# Patient Record
Sex: Male | Born: 1955 | Race: White | Hispanic: No | Marital: Married | Smoking: Former smoker
Health system: Southern US, Community
[De-identification: ages and names within clinical notes are randomized; demographics above are authoritative.]

## PROBLEM LIST (undated history)

## (undated) DIAGNOSIS — M1990 Unspecified osteoarthritis, unspecified site: Secondary | ICD-10-CM

## (undated) DIAGNOSIS — I499 Cardiac arrhythmia, unspecified: Secondary | ICD-10-CM

## (undated) DIAGNOSIS — I10 Essential (primary) hypertension: Secondary | ICD-10-CM

## (undated) DIAGNOSIS — Z889 Allergy status to unspecified drugs, medicaments and biological substances status: Secondary | ICD-10-CM

## (undated) DIAGNOSIS — G709 Myoneural disorder, unspecified: Secondary | ICD-10-CM

## (undated) DIAGNOSIS — M549 Dorsalgia, unspecified: Secondary | ICD-10-CM

## (undated) HISTORY — PX: COLONOSCOPY: SHX174

## (undated) HISTORY — PX: OTHER SURGICAL HISTORY: SHX169

## (undated) HISTORY — PX: ATRIAL FIBRILLATION ABLATION: SHX5732

## (undated) HISTORY — PX: TONSILLECTOMY: SUR1361

## (undated) HISTORY — PX: EYE SURGERY: SHX253

## (undated) HISTORY — PX: HERNIA REPAIR: SHX51

## (undated) HISTORY — PX: HEMORRHOID SURGERY: SHX153

## (undated) HISTORY — PX: WISDOM TOOTH EXTRACTION: SHX21

---

## 2016-11-21 HISTORY — PX: LUMBAR FUSION: SHX111

## 2016-12-31 HISTORY — PX: BACK SURGERY: SHX140

## 2019-07-01 HISTORY — PX: SPINAL CORD STIMULATOR TRIAL: SHX5380

## 2019-10-14 HISTORY — PX: SPINAL CORD STIMULATOR IMPLANT: SHX2422

## 2020-05-23 ENCOUNTER — Encounter (HOSPITAL_COMMUNITY): Payer: Self-pay | Admitting: Emergency Medicine

## 2020-05-23 ENCOUNTER — Inpatient Hospital Stay (HOSPITAL_COMMUNITY)
Admission: EM | Admit: 2020-05-23 | Discharge: 2020-05-30 | DRG: 454 | Disposition: A | Discharge status: Home / Self Care | Payer: Commercial Managed Care - PPO | Attending: Internal Medicine | Admitting: Internal Medicine

## 2020-05-23 ENCOUNTER — Other Ambulatory Visit: Payer: Self-pay

## 2020-05-23 ENCOUNTER — Emergency Department (HOSPITAL_COMMUNITY): Payer: Commercial Managed Care - PPO

## 2020-05-23 DIAGNOSIS — H1132 Conjunctival hemorrhage, left eye: Secondary | ICD-10-CM

## 2020-05-23 DIAGNOSIS — M5416 Radiculopathy, lumbar region: Secondary | ICD-10-CM

## 2020-05-23 DIAGNOSIS — G720 Drug-induced myopathy: Secondary | ICD-10-CM | POA: Diagnosis present

## 2020-05-23 DIAGNOSIS — T368X5S Adverse effect of other systemic antibiotics, sequela: Secondary | ICD-10-CM

## 2020-05-23 DIAGNOSIS — Z981 Arthrodesis status: Secondary | ICD-10-CM

## 2020-05-23 DIAGNOSIS — I4891 Unspecified atrial fibrillation: Secondary | ICD-10-CM | POA: Diagnosis present

## 2020-05-23 DIAGNOSIS — I10 Essential (primary) hypertension: Secondary | ICD-10-CM

## 2020-05-23 DIAGNOSIS — Z791 Long term (current) use of non-steroidal anti-inflammatories (NSAID): Secondary | ICD-10-CM

## 2020-05-23 DIAGNOSIS — H1131 Conjunctival hemorrhage, right eye: Secondary | ICD-10-CM | POA: Diagnosis present

## 2020-05-23 DIAGNOSIS — G894 Chronic pain syndrome: Secondary | ICD-10-CM | POA: Diagnosis present

## 2020-05-23 DIAGNOSIS — Z20822 Contact with and (suspected) exposure to covid-19: Secondary | ICD-10-CM | POA: Diagnosis present

## 2020-05-23 DIAGNOSIS — M5441 Lumbago with sciatica, right side: Secondary | ICD-10-CM | POA: Diagnosis not present

## 2020-05-23 DIAGNOSIS — Z419 Encounter for procedure for purposes other than remedying health state, unspecified: Secondary | ICD-10-CM

## 2020-05-23 DIAGNOSIS — K5900 Constipation, unspecified: Secondary | ICD-10-CM

## 2020-05-23 DIAGNOSIS — Z79899 Other long term (current) drug therapy: Secondary | ICD-10-CM

## 2020-05-23 DIAGNOSIS — M48061 Spinal stenosis, lumbar region without neurogenic claudication: Secondary | ICD-10-CM | POA: Diagnosis not present

## 2020-05-23 DIAGNOSIS — K5903 Drug induced constipation: Secondary | ICD-10-CM | POA: Diagnosis not present

## 2020-05-23 DIAGNOSIS — Z885 Allergy status to narcotic agent status: Secondary | ICD-10-CM

## 2020-05-23 DIAGNOSIS — Z6837 Body mass index (BMI) 37.0-37.9, adult: Secondary | ICD-10-CM

## 2020-05-23 DIAGNOSIS — T84226A Displacement of internal fixation device of vertebrae, initial encounter: Secondary | ICD-10-CM | POA: Diagnosis present

## 2020-05-23 DIAGNOSIS — M960 Pseudarthrosis after fusion or arthrodesis: Secondary | ICD-10-CM | POA: Diagnosis present

## 2020-05-23 DIAGNOSIS — Y92230 Patient room in hospital as the place of occurrence of the external cause: Secondary | ICD-10-CM | POA: Diagnosis not present

## 2020-05-23 DIAGNOSIS — M5116 Intervertebral disc disorders with radiculopathy, lumbar region: Secondary | ICD-10-CM | POA: Diagnosis present

## 2020-05-23 DIAGNOSIS — Z9682 Presence of neurostimulator: Secondary | ICD-10-CM

## 2020-05-23 DIAGNOSIS — R296 Repeated falls: Secondary | ICD-10-CM | POA: Diagnosis present

## 2020-05-23 DIAGNOSIS — R262 Difficulty in walking, not elsewhere classified: Secondary | ICD-10-CM

## 2020-05-23 DIAGNOSIS — T402X5A Adverse effect of other opioids, initial encounter: Secondary | ICD-10-CM | POA: Diagnosis not present

## 2020-05-23 DIAGNOSIS — G609 Hereditary and idiopathic neuropathy, unspecified: Secondary | ICD-10-CM | POA: Diagnosis present

## 2020-05-23 HISTORY — DX: Dorsalgia, unspecified: M54.9

## 2020-05-23 NOTE — ED Triage Notes (Signed)
Pt c/o lower back pain for the past few days getting worse today with numbness on his right leg and unable to walk. Pt states he had a hx of back surgery.

## 2020-05-24 ENCOUNTER — Emergency Department (HOSPITAL_COMMUNITY): Payer: Commercial Managed Care - PPO

## 2020-05-24 ENCOUNTER — Inpatient Hospital Stay (HOSPITAL_COMMUNITY): Payer: Commercial Managed Care - PPO

## 2020-05-24 ENCOUNTER — Encounter (HOSPITAL_COMMUNITY): Payer: Self-pay | Admitting: Emergency Medicine

## 2020-05-24 DIAGNOSIS — Z885 Allergy status to narcotic agent status: Secondary | ICD-10-CM | POA: Diagnosis not present

## 2020-05-24 DIAGNOSIS — H1131 Conjunctival hemorrhage, right eye: Secondary | ICD-10-CM | POA: Diagnosis present

## 2020-05-24 DIAGNOSIS — G720 Drug-induced myopathy: Secondary | ICD-10-CM | POA: Diagnosis present

## 2020-05-24 DIAGNOSIS — R262 Difficulty in walking, not elsewhere classified: Secondary | ICD-10-CM | POA: Insufficient documentation

## 2020-05-24 DIAGNOSIS — G609 Hereditary and idiopathic neuropathy, unspecified: Secondary | ICD-10-CM | POA: Diagnosis present

## 2020-05-24 DIAGNOSIS — R296 Repeated falls: Secondary | ICD-10-CM | POA: Diagnosis present

## 2020-05-24 DIAGNOSIS — T368X5S Adverse effect of other systemic antibiotics, sequela: Secondary | ICD-10-CM | POA: Diagnosis not present

## 2020-05-24 DIAGNOSIS — Z20822 Contact with and (suspected) exposure to covid-19: Secondary | ICD-10-CM | POA: Diagnosis present

## 2020-05-24 DIAGNOSIS — K5900 Constipation, unspecified: Secondary | ICD-10-CM | POA: Diagnosis not present

## 2020-05-24 DIAGNOSIS — H1132 Conjunctival hemorrhage, left eye: Secondary | ICD-10-CM | POA: Diagnosis not present

## 2020-05-24 DIAGNOSIS — M48061 Spinal stenosis, lumbar region without neurogenic claudication: Secondary | ICD-10-CM | POA: Diagnosis present

## 2020-05-24 DIAGNOSIS — M5416 Radiculopathy, lumbar region: Secondary | ICD-10-CM | POA: Diagnosis not present

## 2020-05-24 DIAGNOSIS — M5441 Lumbago with sciatica, right side: Secondary | ICD-10-CM | POA: Diagnosis present

## 2020-05-24 DIAGNOSIS — Y92230 Patient room in hospital as the place of occurrence of the external cause: Secondary | ICD-10-CM | POA: Diagnosis not present

## 2020-05-24 DIAGNOSIS — T84226A Displacement of internal fixation device of vertebrae, initial encounter: Secondary | ICD-10-CM | POA: Diagnosis present

## 2020-05-24 DIAGNOSIS — G894 Chronic pain syndrome: Secondary | ICD-10-CM | POA: Diagnosis present

## 2020-05-24 DIAGNOSIS — Z9682 Presence of neurostimulator: Secondary | ICD-10-CM | POA: Diagnosis not present

## 2020-05-24 DIAGNOSIS — I4891 Unspecified atrial fibrillation: Secondary | ICD-10-CM | POA: Diagnosis present

## 2020-05-24 DIAGNOSIS — T402X5A Adverse effect of other opioids, initial encounter: Secondary | ICD-10-CM | POA: Diagnosis not present

## 2020-05-24 DIAGNOSIS — Z791 Long term (current) use of non-steroidal anti-inflammatories (NSAID): Secondary | ICD-10-CM | POA: Diagnosis not present

## 2020-05-24 DIAGNOSIS — M5116 Intervertebral disc disorders with radiculopathy, lumbar region: Secondary | ICD-10-CM | POA: Diagnosis present

## 2020-05-24 DIAGNOSIS — Z981 Arthrodesis status: Secondary | ICD-10-CM | POA: Diagnosis not present

## 2020-05-24 DIAGNOSIS — I10 Essential (primary) hypertension: Secondary | ICD-10-CM | POA: Diagnosis present

## 2020-05-24 DIAGNOSIS — Z6837 Body mass index (BMI) 37.0-37.9, adult: Secondary | ICD-10-CM | POA: Diagnosis not present

## 2020-05-24 DIAGNOSIS — Z79899 Other long term (current) drug therapy: Secondary | ICD-10-CM | POA: Diagnosis not present

## 2020-05-24 DIAGNOSIS — M960 Pseudarthrosis after fusion or arthrodesis: Secondary | ICD-10-CM | POA: Diagnosis present

## 2020-05-24 DIAGNOSIS — K5903 Drug induced constipation: Secondary | ICD-10-CM | POA: Diagnosis not present

## 2020-05-24 LAB — URINALYSIS, ROUTINE W REFLEX MICROSCOPIC
Bilirubin Urine: NEGATIVE
Glucose, UA: NEGATIVE mg/dL
Hgb urine dipstick: NEGATIVE
Ketones, ur: NEGATIVE mg/dL
Leukocytes,Ua: NEGATIVE
Nitrite: NEGATIVE
Protein, ur: NEGATIVE mg/dL
Specific Gravity, Urine: >1.046 — ABNORMAL HIGH (ref 1.005–1.030)
pH: 5 (ref 5.0–8.0)

## 2020-05-24 LAB — BASIC METABOLIC PANEL
Anion gap: 9 (ref 5–15)
BUN: 16 mg/dL (ref 8–23)
CO2: 30 mmol/L (ref 22–32)
Calcium: 9.1 mg/dL (ref 8.9–10.3)
Chloride: 100 mmol/L (ref 98–111)
Creatinine, Ser: 1.05 mg/dL (ref 0.61–1.24)
GFR, Estimated: >60 mL/min (ref >60)
Glucose, Bld: 106 mg/dL — ABNORMAL HIGH (ref 70–99)
Potassium: 3.6 mmol/L (ref 3.5–5.1)
Sodium: 139 mmol/L (ref 135–145)

## 2020-05-24 LAB — CBC
HCT: 45.5 % (ref 39.0–52.0)
Hemoglobin: 14.8 g/dL (ref 13.0–17.0)
MCH: 30.3 pg (ref 26.0–34.0)
MCHC: 32.5 g/dL (ref 30.0–36.0)
MCV: 93.2 fL (ref 80.0–100.0)
Platelets: 195 10*3/uL (ref 150–400)
RBC: 4.88 MIL/uL (ref 4.22–5.81)
RDW: 13.4 % (ref 11.5–15.5)
WBC: 9.8 10*3/uL (ref 4.0–10.5)
nRBC: 0 % (ref 0.0–0.2)

## 2020-05-24 LAB — RESPIRATORY PANEL BY RT PCR (FLU A&B, COVID)
Influenza A by PCR: NEGATIVE
Influenza B by PCR: NEGATIVE
SARS Coronavirus 2 by RT PCR: NEGATIVE

## 2020-05-24 MED ORDER — KETOROLAC TROMETHAMINE 15 MG/ML IJ SOLN
15.0000 mg | Freq: Four times a day (QID) | INTRAMUSCULAR | Status: DC | PRN
  Administered 2020-05-24: 15 mg via INTRAVENOUS
  Filled 2020-05-24: qty 1

## 2020-05-24 MED ORDER — KETOROLAC TROMETHAMINE 15 MG/ML IJ SOLN: 15.0000 mg | Freq: Four times a day (QID) | INTRAMUSCULAR | Status: DC | PRN

## 2020-05-24 MED ORDER — METHOCARBAMOL 750 MG PO TABS: 750.0000 mg | ORAL_TABLET | Freq: Three times a day (TID) | ORAL | Status: DC | PRN

## 2020-05-24 MED ORDER — CHLORHEXIDINE GLUCONATE CLOTH 2 % EX PADS
6.0000 | MEDICATED_PAD | Freq: Once | CUTANEOUS | Status: AC
  Administered 2020-05-24: 6 via TOPICAL

## 2020-05-24 MED ORDER — MELOXICAM 7.5 MG PO TABS
15.0000 mg | ORAL_TABLET | Freq: Every day | ORAL | Status: DC
  Administered 2020-05-24 – 2020-05-26 (×2): 15 mg via ORAL
  Filled 2020-05-24 (×3): qty 2

## 2020-05-24 MED ORDER — FLUTICASONE PROPIONATE 50 MCG/ACT NA SUSP: 1.0000 | Freq: Every day | NASAL | Status: DC | PRN

## 2020-05-24 MED ORDER — SCOPOLAMINE 1 MG/3DAYS TD PT72
1.0000 | MEDICATED_PATCH | TRANSDERMAL | Status: DC
  Administered 2020-05-25: 1 via TRANSDERMAL
  Filled 2020-05-24: qty 1

## 2020-05-24 MED ORDER — LOSARTAN POTASSIUM 50 MG PO TABS
50.0000 mg | ORAL_TABLET | Freq: Every day | ORAL | Status: DC
  Administered 2020-05-26 – 2020-05-30 (×5): 50 mg via ORAL
  Filled 2020-05-24 (×5): qty 1

## 2020-05-24 MED ORDER — CHLORHEXIDINE GLUCONATE CLOTH 2 % EX PADS: 6.0000 | MEDICATED_PAD | Freq: Once | CUTANEOUS | Status: DC

## 2020-05-24 MED ORDER — ACETAMINOPHEN 500 MG PO TABS: 1000.0000 mg | ORAL_TABLET | Freq: Three times a day (TID) | ORAL | Status: DC | PRN

## 2020-05-24 MED ORDER — HYDROMORPHONE HCL 1 MG/ML IJ SOLN
0.5000 mg | Freq: Once | INTRAMUSCULAR | Status: AC
  Administered 2020-05-24: 0.5 mg via INTRAVENOUS
  Filled 2020-05-24: qty 1

## 2020-05-24 MED ORDER — FENTANYL CITRATE (PF) 100 MCG/2ML IJ SOLN
50.0000 ug | Freq: Once | INTRAMUSCULAR | Status: AC
  Administered 2020-05-24: 50 ug via INTRAVENOUS
  Filled 2020-05-24: qty 2

## 2020-05-24 MED ORDER — MIDAZOLAM HCL 2 MG/2ML IJ SOLN: 1.0000 mg | Freq: Once | INTRAMUSCULAR | Status: DC

## 2020-05-24 MED ORDER — HYDROMORPHONE HCL 1 MG/ML IJ SOLN
1.0000 mg | Freq: Once | INTRAMUSCULAR | Status: AC
  Administered 2020-05-24: 1 mg via INTRAVENOUS
  Filled 2020-05-24: qty 1

## 2020-05-24 MED ORDER — ENOXAPARIN SODIUM 40 MG/0.4ML ~~LOC~~ SOLN
40.0000 mg | SUBCUTANEOUS | Status: DC
  Administered 2020-05-24 – 2020-05-30 (×5): 40 mg via SUBCUTANEOUS
  Filled 2020-05-24 (×6): qty 0.4

## 2020-05-24 MED ORDER — HYDROMORPHONE HCL 1 MG/ML IJ SOLN
0.5000 mg | INTRAMUSCULAR | Status: DC | PRN
  Administered 2020-05-24 – 2020-05-26 (×7): 0.5 mg via INTRAVENOUS
  Filled 2020-05-24 (×2): qty 0.5
  Filled 2020-05-24: qty 1
  Filled 2020-05-24 (×2): qty 0.5

## 2020-05-24 MED ORDER — VITAMIN B-12 1000 MCG PO TABS
1000.0000 ug | ORAL_TABLET | Freq: Every day | ORAL | Status: DC
  Administered 2020-05-26 – 2020-05-30 (×5): 1000 ug via ORAL
  Filled 2020-05-24 (×5): qty 1

## 2020-05-24 MED ORDER — HYDROCHLOROTHIAZIDE 12.5 MG PO CAPS
12.5000 mg | ORAL_CAPSULE | Freq: Every day | ORAL | Status: DC
  Administered 2020-05-24 – 2020-05-30 (×6): 12.5 mg via ORAL
  Filled 2020-05-24 (×6): qty 1

## 2020-05-24 MED ORDER — TRAMADOL HCL 50 MG PO TABS: 50.0000 mg | ORAL_TABLET | Freq: Four times a day (QID) | ORAL | Status: DC | PRN

## 2020-05-24 MED ORDER — ACETAMINOPHEN 500 MG PO TABS
1000.0000 mg | ORAL_TABLET | Freq: Once | ORAL | Status: AC
  Administered 2020-05-25: 1000 mg via ORAL
  Filled 2020-05-24: qty 2

## 2020-05-24 MED ORDER — LOSARTAN POTASSIUM-HCTZ 50-12.5 MG PO TABS: 1.0000 | ORAL_TABLET | Freq: Every day | ORAL | Status: DC

## 2020-05-24 NOTE — ED Notes (Signed)
Followed up with MRI, MRI still waiting on mechanical device representative. Have no timeline will they will arrive. Pt updated.

## 2020-05-24 NOTE — H&P (Signed)
History and Physical    [name] DGL:[id number] DOB: [birth year] DOA: 05/23/2020  PCP: Patient, No Pcp Per (Confirm with patient/family/NH records and if not entered, this has to be entered at [facility] point of entry) Patient coming from: Home  I have personally briefly reviewed patient's old medical records in [facility]  Chief Complaint: Falls  HPI: Thomas Savage is a 64 y.o. male with medical history significant of severe sciatica with right leg pain status post multiple back surgery, and status post spinal stimulator, HTN, idiopathic neuropathy, presented with worsening of right leg pain and weakness.  Baseline ambulation status was able to ambulate despite her chronic back pain and shooting pain to right leg, but last Friday, patient started to experience breakthrough pains, shooting leg from back to side of right thigh and right calf.  He fell several times because of the right leg pain and weakness.  Denies any trouble urinate or bowel movement no fever chills.  No loss of consciousness.  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  Failed trial of Lyrica and gabapentin for worsening of balance problems. ED Course: CT lumbar spine showed extensive postsurgical changes, normal white count, neurosurgery was consulted, who want to obtain MRI and plan for lumbar spine surgery tomorrow to relieve symptoms.  Review of Systems: As per HPI otherwise 14 point review of systems negative.   Past Medical History:  Diagnosis Date  . Back pain     Past Surgical History:  Procedure Laterality Date  . BACK SURGERY  12/31/2016   Oblique Lateral Interbody Fusion with Allograft Dr. Alferd Patee  . LUMBAR FUSION  11/21/2016   L4-S1 Posterior Spinal Fusion   . SPINAL CORD STIMULATOR  IMPLANT Left 10/14/2019   Dr. Petra Kuba  . SPINAL CORD STIMULATOR TRIAL  07/01/2019   Dr. Petra Kuba     has no history on file for tobacco use, alcohol use, and drug use.  Allergies  Allergen Reactions  . Morphine And Related     No family history on file.   Prior to Admission medications   Medication Sig Start Date End Date Taking? Authorizing Provider  acetaminophen (TYLENOL) 500 MG tablet Take 1,000 mg by mouth every 8 (eight) hours as needed for moderate pain.   Yes [provider]  fluticasone (FLONASE) 50 MCG/ACT nasal spray Place 1 spray into both nostrils daily as needed for allergies or rhinitis.   Yes [provider]  losartan-hydrochlorothiazide (HYZAAR) 50-12.5 MG tablet Take 1 tablet by mouth daily. 04/18/20  Yes [provider]  meloxicam (MOBIC) 15 MG tablet Take 15 mg by mouth daily.   Yes [provider]  methocarbamol (ROBAXIN) 750 MG tablet Take 750 mg by mouth every 8 (eight) hours as needed for muscle spasms.   Yes [provider]  traMADol (ULTRAM) 50 MG tablet Take 50 mg by mouth every 6 (six) hours as needed for moderate pain.   Yes [provider]  vitamin B-12 (CYANOCOBALAMIN) 1000 MCG tablet Take 1,000 mcg by mouth daily.   Yes [provider]    Physical Exam: Vitals:   05/24/20 1100 05/24/20 1118 05/24/20 1130 05/24/20 1215  BP:  (!) 140/92 134/86 (!) 145/83  Pulse: 65 67 68 74  Resp: 19 14 14 16   Temp:  TempSrc:      SpO2: 91% 97% 100% 99%  Weight:      Height:        Constitutional: NAD, calm, comfortable Vitals:   05/24/20 1100 05/24/20 1118 05/24/20 1130 05/24/20 1215  BP:  (!) 140/92 134/86 (!) 145/83  Pulse: 65 67 68 74  Resp: 19 14 14 16   Temp:      TempSrc:      SpO2: 91% 97% 100% 99%  Weight:      Height:       Eyes: PERRL, lids and conjunctivae normal ENMT: Mucous membranes are moist. Posterior pharynx clear of any exudate or lesions.Normal dentition.    Neck: normal, supple, no masses, no thyromegaly Respiratory: clear to auscultation bilaterally, no wheezing, no crackles. Normal respiratory effort. No accessory muscle use.  Cardiovascular: Regular rate and rhythm, no murmurs / rubs / gallops. No extremity edema. 2+ pedal pulses. No carotid bruits.  Abdomen: no tenderness, no masses palpated. No hepatosplenomegaly. Bowel sounds positive.  Musculoskeletal: no clubbing / cyanosis. No joint deformity upper and lower extremities. Good ROM, no contractures. Normal muscle tone.  Skin: no rashes, lesions, ulcers. No induration Neurologic: CN 2-12 grossly intact. Sensation intact, DTR normal.  Decreased muscle strength of right leg, positive straight leg elevation test to about 15 to 20 degrees with excruciating pain shooting down the back to right calf.  No trouble moving right foot.  Decreased light touch sensation of the right lateral foot compared to the left side. Psychiatric: Normal judgment and insight. Alert and oriented x 3. Normal mood.     Labs on Admission: I have personally reviewed following labs and imaging studies  CBC: Recent Labs  Lab 05/24/20 0353  WBC 9.8  HGB 14.8  HCT 45.5  MCV 93.2  PLT 195   Basic Metabolic Panel: Recent Labs  Lab 05/24/20 0353  NA 139  K 3.6  CL 100  CO2 30  GLUCOSE 106*  BUN 16  CREATININE 1.05  CALCIUM 9.1   GFR: Estimated Creatinine Clearance: 102.4 mL/min (by C-G formula based on SCr of 1.05 mg/dL). Liver Function Tests: No results for input(s): AST, ALT, ALKPHOS, BILITOT, PROT, ALBUMIN in the last 168 hours. No results for input(s): LIPASE, AMYLASE in the last 168 hours. No results for input(s): AMMONIA in the last 168 hours. Coagulation Profile: No results for input(s): INR, PROTIME in the last 168 hours. Cardiac Enzymes: No results for input(s): CKTOTAL, CKMB, CKMBINDEX, TROPONINI in the last 168 hours. BNP (last 3 results) No results for input(s): PROBNP in the last 8760  hours. HbA1C: No results for input(s): HGBA1C in the last 72 hours. CBG: No results for input(s): GLUCAP in the last 168 hours. Lipid Profile: No results for input(s): CHOL, HDL, LDLCALC, TRIG, CHOLHDL, LDLDIRECT in the last 72 hours. Thyroid Function Tests: No results for input(s): TSH, T4TOTAL, FREET4, T3FREE, THYROIDAB in the last 72 hours. Anemia Panel: No results for input(s): VITAMINB12, FOLATE, FERRITIN, TIBC, IRON, RETICCTPCT in the last 72 hours. Urine analysis:    Component Value Date/Time   COLORURINE YELLOW 05/24/2020 0834   APPEARANCEUR CLEAR 05/24/2020 0834   LABSPEC >1.046 (H) 05/24/2020 0834   PHURINE 5.0 05/24/2020 0834   GLUCOSEU NEGATIVE 05/24/2020 0834   HGBUR NEGATIVE 05/24/2020 0834   BILIRUBINUR NEGATIVE 05/24/2020 0834   KETONESUR NEGATIVE 05/24/2020 0834   PROTEINUR NEGATIVE 05/24/2020 0834   NITRITE NEGATIVE 05/24/2020 0834   LEUKOCYTESUR NEGATIVE 05/24/2020 0834    Radiological Exams on Admission: DG Lumbar  Spine Complete  Result Date: 05/23/2020 CLINICAL DATA:  Pain EXAM: LUMBAR SPINE - COMPLETE 4+ VIEW COMPARISON:  None. FINDINGS: There is no evidence of lumbar spine fracture. Alignment is normal. Lower lumbar spine fixation hardware seen at L4 through S1. There is an interbody fusion seen at L4-L5 which appears slightly inferiorly position when in comparison to a prior exam dating back to 2018. Anterior lumbar interbody fusion seen at L5-S1. Overlying spinal stimulator is noted. Mild disc height loss and facet arthrosis seen in the lower lumbar spine. IMPRESSION: 1. No acute osseous abnormality. 2. Status post interbody fusion and posterior fixation from L4 through S1. Interbody fusion L4-L5 appears to be slightly inferiorly position when compared to prior exam and if further evaluation is required would recommend CT to determine hardware complication. Electronically Signed   By: Jonna Clark M.D.   On: 05/23/2020 17:11   CT LUMBAR SPINE W  CONTRAST  Result Date: 05/24/2020 CLINICAL DATA:  New onset right leg pain and numbness. History of back pain with neurostimulator in place EXAM: CT LUMBAR SPINE WITH CONTRAST TECHNIQUE: Multidetector CT imaging of the lumbar spine was performed with intravenous contrast administration. CONTRAST:  100 cc Omnipaque 300 intravenous COMPARISON:  01/08/2019 lumbar MRI FINDINGS: Segmentation: 5 lumbar type vertebrae Alignment: Fused L5-S1 anterolisthesis Vertebrae: L4-5 and L5-S1 posterior-lateral fusion with rod and pedicle screw. Discectomy cages at L4-5 and L5-S1. Bridging bone is not seen at L4-5 and there is L4 and S1 screw loosening. Intervertebral gas is seen at the L5-S1 disc space but there is bridging bone demonstrated best on coronal reformats. The ALIF cage shows rotation and subsidence. Scattered sclerotic foci compatible with bone islands. No evidence of acute fracture.  No aggressive bone lesion. Paraspinal and other soft tissues: Atheromatous plaque of the aorta. Disc levels: T12- L1: Unremarkable. L1-L2: Unremarkable. L2-L3: Disc narrowing and bulging. Facet osteoarthritis with spurring and ligamentum flavum thickening. High-grade appearing spinal stenosis. Moderate left and mild right foraminal narrowing L3-L4: Facet osteoarthritis with bony and ligamentous hypertrophy. The disc is narrowed and bulging and there is high-grade appearing spinal stenosis, with some limitation by streak artifact from hardware. Likely moderate bilateral foraminal narrowing L4-L5: PLIF with no convincing arthrodesis. There is left paracentral ridging which could impinge on the descending L5 nerve root. Laminectomy. Mild left foraminal narrowing L5-S1:Fusion with prominent subsidence of the cage and spurring causing mild to moderate bilateral foraminal stenosis. The spinal canal is likely patent after laminectomy. IMPRESSION: 1. No acute finding. 2. Advanced degenerative spinal stenosis at L2-3 and L3-4, also seen on a 2020  lumbar MRI. 3. L4-5 and L5-S1 fusion.  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 Electronically Signed   By: Marnee Spring M.D.   On: 05/24/2020 05:59    EKG: Ordered  Assessment/Plan Active Problems:   Impaired ambulation  (please populate well all problems here in Problem List. (For example, if patient is on BP meds at home and you resume or decide to hold them, it is a problem that needs to be her. Same for CAD, COPD, HLD and so on)  Acute on chronic ambulation dysfunction secondary to sciatica -Continue Dilaudid as needed, add Toradol -Patient reported he was on gabapentin and Lyrica however both made his balance issue getting worse. Not willing to try again this time. -MRI pending, and neurosurgery on board, NPO after midnight. -Good exercise tolerance > , medically cleared for back surgery and general anesthesia if needed. -Hold PT evaluation after back surgery.  HTN -Controlled, continue home meds.  DVT prophylaxis: Lovenox Code Status: Full code Family Communication: None at bedside Disposition Plan: Expect more than 2 midnight hospital stay for lumbar spine surgery and PT evaluation, likely will need rehab versus SNF Consults called: Neurosurgery Admission status: MedSurg   Emeline General MD [facility] Pager [phone]  05/24/2020, 1:17 PM

## 2020-05-24 NOTE — Progress Notes (Signed)
Patient brought to MRI for the second time. We had the [facility] neurostimulator representative here with [place] this time to check the device. She was able to turn the device off for [place], and run the impedence check on the device. The impedence check failed, one of his leads is greater than 10 ohms. This is a safety issue and the patient cannot have a MRI exam because this shows there is something wrong with one of his leads. Neurosurgeon aware and patient sent back to the ED.

## 2020-05-24 NOTE — ED Provider Notes (Signed)
Care handoff received from Mia McDonald PA-C at shift change please see previous providers note for full details of visit.  In [place] 64 year old male presented with acute on chronic right lower back pain with sciatica and right lower extremity weakness.  CT lumbar spine obtained showing multiple areas of stenosis, patient also has spinal stimulator.  Plan of care at shift change was consultation to neurosurgery.  At shift change neurosurgery called back they spoke with Frederik Pear, advised MRI lumbar spine with and without contrast, neurosurgery to see. Physical Exam  BP (!) 145/83   Pulse 74   Temp 98.1 F (36.7 C) (Oral)   Resp 16   Ht 6\' 2"  (1.88 m)   Wt 131.5 kg   SpO2 99%   BMI 37.23 kg/m   Physical Exam Constitutional:      General: He is not in acute distress.    Appearance: Normal appearance. He is not ill-appearing or toxic-appearing.  HENT:     Head: Normocephalic and atraumatic.     Right Ear: External ear normal.     Left Ear: External ear normal.  Eyes:     General: Vision grossly intact. Gaze aligned appropriately.  Neck:     Trachea: Trachea and phonation normal.  Musculoskeletal:     Cervical back: Normal range of motion and neck supple.  Skin:    General: Skin is warm and dry.  Neurological:     Mental Status: He is alert and oriented to person, place, and time.     GCS: GCS eye subscore is 4. GCS verbal subscore is 5. GCS motor subscore is 6.  Psychiatric:        Mood and Affect: Mood normal.        Behavior: Behavior normal.     ED Course/Procedures      Procedures   CBC within normal limits no leukocytosis to suggest infection, no anemia. BMP without emergent electrolyte derangement AKI or gap. Covid/influenza panel negative. Urinalysis without evidence of infection or hematuria to suggest stone disease.  MDM  8:17 AM: I spoke with neurosurgery team, , advises to attempt transfer to [facility] since they placed the spinal  stimulator. - Discussed situation with patient and his wife.spinal stimulator was placed under [facility] system by anesthesiologist Dr. [facility] outpatient.  Therefore postoperatively patient developed an infection and no longer wish to follow-up there. Additionally previous spinal surgeries done with [facility] were at [facility], they do not follow-up with [facility] and would like their care to be performed here at [facility] health. - 8:43 AM: Spoke with neurosurgery team and updated them on patient's preference, they will come by to see patient. - Patient was reassessed multiple times during this visit pain controlled with multiple rounds of narcotic medicine.  Patient had been taken to MRI unfortunately the spinal stimulator remote is not functioning so the representative is being called in to turn off the spinal stimulator prior to MRI being obtained. - 12:25 PM: Spoke with neurosurgery Josh NP, advises medicine admission and they plan for surgery tomorrow with [facility]. - 12:58 PM: Spoke with Dr. Venetia Maxon, patient accepted to hospitalist service.  - 1:05 PM: Patient reassessed, resting comfortably no acute distress states understanding of care plan and is agreeable for admission.   Note: Portions of this report may have been transcribed using voice recognition software. Every effort was made to ensure accuracy; however, inadvertent computerized transcription errors may still be present.   Chipper Herb, PA-C 05/24/20 1313  Jacalyn Lefevre, MD 05/24/20 1506

## 2020-05-24 NOTE — H&P (View-Only) (Signed)
Reason for Consult:Low back and RLE pain Referring Physician: Barkley Boards, PA-C   HPI: Thomas Savage is an 64 y.o. male with a past medical history significant for severe sciatica with right leg pain s/p L4/5 and L5/S1 decompression and fusion, and s/p spinal stimulator, HTN, idiopathic neuropathy, presented complaints of right-sided low back pain that radiated into his right buttock and down into his right anterior leg, stopping superior to the knee. He reported an onset of symptoms about 4 days ago which have progressively worsened. He now reports RLE numbness and severe RLE weakness and the inability to ambulate in addition to his pain. He has had three falls over the last few days which the patient stated "my leg just gave way." Denies any bowel or bladder incontinence. No loss of consciousness.   CT imaging was performed which showed spinal stenosis at the L2/3 and L3/4 levels which subsequently led to neurosurgical evaluation. MRI lumbar spine was requested but was unable to be obtained due to spinal stimulator failing impedance test.   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  Failed trial of Lyrica and gabapentin for worsening of balance problems.  Past Medical History:  Diagnosis Date  . Back pain     Past Surgical History:  Procedure Laterality Date  . BACK SURGERY  12/31/2016   Oblique Lateral Interbody Fusion with Allograft Dr. Alferd Patee  . LUMBAR FUSION  11/21/2016   L4-S1 Posterior Spinal Fusion   . SPINAL CORD STIMULATOR IMPLANT Left 10/14/2019   Dr. Petra Kuba  . SPINAL CORD STIMULATOR TRIAL  07/01/2019   Dr. Petra Kuba    No family history on file.  Social History:  has no history on file for tobacco use, alcohol use, and drug use.  Allergies:   Allergies  Allergen Reactions  . Morphine And Related     Medications: I have reviewed the patient's current medications.  Results for orders placed or performed during the hospital encounter of 05/23/20 (from the past 48 hour(s))  CBC     Status: None   Collection Time: 05/24/20  3:53 AM  Result Value Ref Range   WBC 9.8 4.0 - 10.5 K/uL   RBC 4.88 4.22 - 5.81 MIL/uL   Hemoglobin 14.8 13.0 - 17.0 g/dL   HCT 68.3 39 - 52 %   MCV 93.2 80.0 - 100.0 fL   MCH 30.3 26.0 - 34.0 pg   MCHC 32.5 30.0 - 36.0 g/dL   RDW 41.9 62.2 - 29.7 %   Platelets 195 150 - 400 K/uL   nRBC 0.0 0.0 - 0.2 %    Comment: Performed at [facility], [address], [place], [place] [address]  Basic metabolic panel     Status: Abnormal   Collection Time: 05/24/20  3:53 AM  Result Value Ref Range   Sodium 139 135 - 145 mmol/L   Potassium 3.6 3.5 - 5.1 mmol/L   Chloride 100 98 - 111 mmol/L   CO2 30 22 - 32 mmol/L   Glucose, Bld 106 (H) 70 - 99 mg/dL    Comment: Glucose reference range applies only to samples taken after fasting for at least 8 hours.   BUN 16 8 - 23 mg/dL   Creatinine, Ser 1.94 0.61 - 1.24 mg/dL   Calcium 9.1 8.9 -  10.3 mg/dL   GFR, Estimated >37 >62 mL/min    Comment: (NOTE) Calculated using the CKD-EPI Creatinine Equation (2021)    Anion gap 9 5 - 15    Comment: Performed at [facility], [address] [place], [place] [address]  Respiratory Panel by RT PCR (Flu A&B, Covid) - Nasopharyngeal Swab     Status: None   Collection Time: 05/24/20  3:53 AM   Specimen: Nasopharyngeal Swab  Result Value Ref Range   SARS Coronavirus 2 by RT PCR NEGATIVE NEGATIVE    Comment: (NOTE) SARS-CoV-2 target nucleic acids are NOT DETECTED.  The SARS-CoV-2 RNA is generally detectable in upper respiratoy specimens during the acute phase of infection. The lowest concentration of SARS-CoV-2 viral copies this assay can detect is 131 copies/mL. A negative result does not preclude  SARS-Cov-2 infection and should not be used as the sole basis for treatment or other patient management decisions. A negative result may occur with  improper specimen collection/handling, submission of specimen other than nasopharyngeal swab, presence of viral mutation(s) within the areas targeted by this assay, and inadequate number of viral copies (<131 copies/mL). A negative result must be combined with clinical observations, patient history, and epidemiological information. The expected result is Negative.  Fact Sheet for Patients:  https://www.moore.com/  Fact Sheet for Healthcare Providers:  https://www.young.biz/  This test is no t yet approved or cleared by the [place] FDA and  has been authorized for detection and/or diagnosis of SARS-CoV-2 by FDA under an Emergency Use Authorization (EUA). This EUA will remain  in effect (meaning this test can be used) for the duration of the COVID-19 declaration under Section 564(b)(1) of the Act, 21 U.S.C. section 360bbb-3(b)(1), unless the authorization is terminated or revoked sooner.     Influenza A by PCR NEGATIVE NEGATIVE   Influenza B by PCR NEGATIVE NEGATIVE    Comment: (NOTE) The Xpert Xpress SARS-CoV-2/FLU/RSV assay is intended as an aid in  the diagnosis of influenza from Nasopharyngeal swab specimens and  should not be used as a sole basis for treatment. Nasal washings and  aspirates are unacceptable for Xpert Xpress SARS-CoV-2/FLU/RSV  testing.  Fact Sheet for Patients: https://www.moore.com/  Fact Sheet for Healthcare Providers: https://www.young.biz/  This test is not yet approved or cleared by the [place] FDA and  has been authorized for detection and/or diagnosis of SARS-CoV-2 by  FDA under an Emergency Use Authorization (EUA). This EUA will remain  in effect (meaning this test can be used) for the duration of the  Covid-19  declaration under Section 564(b)(1) of the Act, 21  U.S.C. section 360bbb-3(b)(1), unless the authorization is  terminated or revoked. Performed at [facility], [address], [place], [place] [address]   Urinalysis, Routine w reflex microscopic Urine, Clean Catch     Status: Abnormal   Collection Time: 05/24/20  8:34 AM  Result Value Ref Range   Color, Urine YELLOW YELLOW   APPearance CLEAR CLEAR   Specific Gravity, Urine >1.046 (H) 1.005 - 1.030   pH 5.0 5.0 - 8.0   Glucose, UA NEGATIVE NEGATIVE mg/dL   Hgb urine dipstick NEGATIVE NEGATIVE   Bilirubin Urine NEGATIVE NEGATIVE   Ketones, ur NEGATIVE NEGATIVE mg/dL   Protein, ur NEGATIVE NEGATIVE mg/dL   Nitrite NEGATIVE NEGATIVE   Leukocytes,Ua NEGATIVE NEGATIVE    Comment: Performed at [facility], [address] [place], [place] [address]    DG Lumbar Spine Complete  Result Date:  05/23/2020 CLINICAL DATA:  Pain EXAM: LUMBAR SPINE - COMPLETE 4+ VIEW COMPARISON:  None. FINDINGS: There is no evidence of lumbar spine fracture. Alignment is normal. Lower lumbar spine fixation hardware seen at L4 through S1. There is an interbody fusion seen at L4-L5 which appears slightly inferiorly position when in comparison to a prior exam dating back to 2018. Anterior lumbar interbody fusion seen at L5-S1. Overlying spinal stimulator is noted. Mild disc height loss and facet arthrosis seen in the lower lumbar spine. IMPRESSION: 1. No acute osseous abnormality. 2. Status post interbody fusion and posterior fixation from L4 through S1. Interbody fusion L4-L5 appears to be slightly inferiorly position when compared to prior exam and if further evaluation is required would recommend CT to determine hardware complication. Electronically Signed   By: Jonna ClarkBindu  Avutu M.D.   On: 05/23/2020 17:11   CT LUMBAR SPINE W CONTRAST  Result Date: 05/24/2020 CLINICAL DATA:  New onset right leg pain and numbness. History of back pain with neurostimulator in  place EXAM: CT LUMBAR SPINE WITH CONTRAST TECHNIQUE: Multidetector CT imaging of the lumbar spine was performed with intravenous contrast administration. CONTRAST:  100 cc Omnipaque 300 intravenous COMPARISON:  01/08/2019 lumbar MRI FINDINGS: Segmentation: 5 lumbar type vertebrae Alignment: Fused L5-S1 anterolisthesis Vertebrae: L4-5 and L5-S1 posterior-lateral fusion with rod and pedicle screw. Discectomy cages at L4-5 and L5-S1. Bridging bone is not seen at L4-5 and there is L4 and S1 screw loosening. Intervertebral gas is seen at the L5-S1 disc space but there is bridging bone demonstrated best on coronal reformats. The ALIF cage shows rotation and subsidence. Scattered sclerotic foci compatible with bone islands. No evidence of acute fracture.  No aggressive bone lesion. Paraspinal and other soft tissues: Atheromatous plaque of the aorta. Disc levels: T12- L1: Unremarkable. L1-L2: Unremarkable. L2-L3: Disc narrowing and bulging. Facet osteoarthritis with spurring and ligamentum flavum thickening. High-grade appearing spinal stenosis. Moderate left and mild right foraminal narrowing L3-L4: Facet osteoarthritis with bony and ligamentous hypertrophy. The disc is narrowed and bulging and there is high-grade appearing spinal stenosis, with some limitation by streak artifact from hardware. Likely moderate bilateral foraminal narrowing L4-L5: PLIF with no convincing arthrodesis. There is left paracentral ridging which could impinge on the descending L5 nerve root. Laminectomy. Mild left foraminal narrowing L5-S1:Fusion with prominent subsidence of the cage and spurring causing mild to moderate bilateral foraminal stenosis. The spinal canal is likely patent after laminectomy. IMPRESSION: 1. No acute finding. 2. Advanced degenerative spinal stenosis at L2-3 and L3-4, also seen on a 2020 lumbar MRI. 3. L4-5 and L5-S1 fusion.  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 Electronically Signed   By: Marnee SpringJonathon  Watts M.D.   On:  05/24/2020 05:59    Review of Systems Blood pressure 127/81, pulse 67, temperature 98.1 F (36.7 C), temperature source Oral, resp. rate 14, height 6\' 2"  (1.88 m), weight 131.5 kg, SpO2 98 %. Physical Exam Constitutional:      General: He is not in acute distress.    Appearance: Normal appearance. He is obese.  Neurological:     Mental Status: He is alert and oriented to person, place, and time. Mental status is at baseline.     Sensory: No sensory deficit.     Motor: Weakness present.     Deep Tendon Reflexes: Reflexes normal.     Comments: RLE weakness. Right EHL 4/5, right hip flexor 3+/5  Psychiatric:        Mood and Affect: Mood normal.  Behavior: Behavior normal.        Thought Content: Thought content normal.        Judgment: Judgment normal.     Assessment/Plan: 64 y.o. male with new onset of low back pain and RLE radiculopathy. The patients imaging was most remarkable for degeniration and arthritic changes at the L2/3 and L3/4 levels leading to multifactorial spinal and foraminal stenosis. Plan for patient to undergo L2/3 - L3/4 posterior lumbar interbody fusion with pedicle screw fixation with extension of hardware to L2. Surgery scheduled for tomorrow morning. Admit to medicine. NPO after midnight.   Council Mechanic, DNP, NP-C 05/24/2020, 4:24 PM

## 2020-05-24 NOTE — ED Notes (Signed)
Patient transported to MRI 

## 2020-05-24 NOTE — Anesthesia Preprocedure Evaluation (Addendum)
Anesthesia Evaluation  Patient identified by MRN, date of birth, ID band Patient awake    Reviewed: Allergy & Precautions, NPO status , Patient's Chart, lab work & pertinent test results  History of Anesthesia Complications Negative for: history of anesthetic complications  Airway Mallampati: II  TM Distance: >3 FB Neck ROM: Full    Dental  (+) Dental Advisory Given   Pulmonary former smoker,  05/24/2020 SARS coronavirus NEG   breath sounds clear to auscultation       Cardiovascular + dysrhythmias Atrial Fibrillation  Rhythm:Regular Rate:Normal     Neuro/Psych Chronic back pain: spinal cord stim,     GI/Hepatic negative GI ROS, Neg liver ROS,   Endo/Other  Morbid obesity  Renal/GU negative Renal ROS     Musculoskeletal   Abdominal (+) + obese,   Peds  Hematology negative hematology ROS (+)   Anesthesia Other Findings   Reproductive/Obstetrics                            Anesthesia Physical Anesthesia Plan  ASA: III  Anesthesia Plan: General   Post-op Pain Management:    Induction: Intravenous  PONV Risk Score and Plan: 3 and Ondansetron, Dexamethasone and Scopolamine patch - Pre-op  Airway Management Planned: Oral ETT  Additional Equipment: None  Intra-op Plan:   Post-operative Plan: Extubation in OR  Informed Consent: I have reviewed the patients History and Physical, chart, labs and discussed the procedure including the risks, benefits and alternatives for the proposed anesthesia with the patient or authorized representative who has indicated his/her understanding and acceptance.     Dental advisory given  Plan Discussed with: CRNA and Surgeon  Anesthesia Plan Comments:        Anesthesia Quick Evaluation

## 2020-05-24 NOTE — ED Provider Notes (Signed)
[facility] EMERGENCY DEPARTMENT Provider Note   CSN: [id number] Arrival date & time: 05/23/20  1629     History Chief Complaint  Patient presents with  . Back Pain    Thomas Savage is a 64 y.o. male with a history of spondylolithiasis, sciatica, lumbar spondylosis, chronic pain syndrome with a history of multiple back surgeries who presents to the ER with a chief complaint of back pain.  The patient reports sudden onset, worsening right-sided low back pain since Friday 11/5.  The pain is constant, 10 out of 10, and characterized as sharp.  It radiates around his right lateral hip and down to the medial aspect of the kneecap.  When he walks or straightens his leg, he has shooting pain.  He is having numbness to his entire right leg.  Since the onset of pain, his leg has given out on him multiple times and he has fallen 4 times.  In regard to the falls, he has not hit his head.  No LOC.  He does not take blood thinners.  He has no other associated pain or injuries related to the falls.  He denies saddle paresthesias, urinary or fecal incontinence, right-sided leg pain, numbness, weakness, left-sided back pain, abdominal pain, nausea, vomiting, diarrhea, penile or testicular pain or swelling, neck pain, chest pain, shortness of breath, or URI symptoms.  He has been treating his symptoms with Tylenol, muscle relaxers, and pain medication with no improvement in his symptoms.   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  The history is provided by the patient and medical records. No language interpreter was used.       Past Medical History:  Diagnosis Date  . Back pain     There are no problems to display for this patient.   Past Surgical History:  Procedure Laterality Date  . BACK SURGERY  12/31/2016   Oblique Lateral Interbody Fusion with Allograft Dr.  Alferd Patee  . LUMBAR FUSION  11/21/2016   L4-S1 Posterior Spinal Fusion   . SPINAL CORD STIMULATOR IMPLANT Left 10/14/2019   Dr. Petra Kuba  . SPINAL CORD STIMULATOR TRIAL  07/01/2019   Dr. Petra Kuba       No family history on file.  Social History   Tobacco Use  . Smoking status: Not on file  Substance Use Topics  . Alcohol use: Not on file  . Drug use: Not on file    Home Medications Prior to Admission medications   Medication Sig Start Date End Date Taking? Authorizing Provider  acetaminophen (TYLENOL) 500 MG tablet Take 1,000 mg by mouth every 8 (eight) hours as needed for moderate pain.   Yes [provider]  fluticasone (FLONASE) 50 MCG/ACT nasal spray Place 1 spray into both nostrils daily as needed for allergies or rhinitis.   Yes [provider]  losartan-hydrochlorothiazide (HYZAAR) 50-12.5 MG tablet Take 1 tablet by mouth daily. 04/18/20  Yes [provider]  meloxicam (MOBIC) 15 MG tablet Take 15 mg by mouth daily.   Yes [provider]  methocarbamol (ROBAXIN) 750 MG tablet Take 750 mg by mouth every 8 (eight) hours as needed for muscle spasms.   Yes [provider]  traMADol (ULTRAM) 50 MG tablet Take 50 mg by mouth every 6 (six) hours as needed for moderate pain.   Yes [provider]  vitamin B-12 (CYANOCOBALAMIN) 1000 MCG tablet Take 1,000  mcg by mouth daily.   Yes [provider]    Allergies    Morphine and related  Review of Systems   Review of Systems  Constitutional: Negative for appetite change, chills, diaphoresis, fatigue and fever.  HENT: Negative for congestion and sore throat.   Eyes: Negative for visual disturbance.  Respiratory: Negative for cough, shortness of breath and wheezing.   Cardiovascular: Negative for chest pain and palpitations.  Gastrointestinal: Negative for abdominal pain, diarrhea, nausea and vomiting.  Genitourinary: Negative for discharge, dysuria, flank pain,  frequency, penile pain, penile swelling and urgency.  Musculoskeletal: Positive for arthralgias, back pain, gait problem and myalgias. Negative for joint swelling and neck stiffness.  Skin: Negative for rash.  Allergic/Immunologic: Negative for immunocompromised state.  Neurological: Positive for weakness and numbness. Negative for dizziness, syncope, facial asymmetry, speech difficulty and headaches.  Psychiatric/Behavioral: Negative for confusion.    Physical Exam Updated Vital Signs BP (!) 164/123   Pulse 72   Temp 98.1 F (36.7 C) (Oral)   Resp 15   Ht  (1.88 m)   Wt 131.5 kg   SpO2 98%   BMI 37.23 kg/m   Physical Exam Vitals and nursing note reviewed.  Constitutional:      General: He is not in acute distress.    Appearance: He is well-developed. He is not ill-appearing, toxic-appearing or diaphoretic.  HENT:     Head: Normocephalic.  Eyes:     Conjunctiva/sclera: Conjunctivae normal.  Cardiovascular:     Rate and Rhythm: Normal rate and regular rhythm.     Heart sounds: No murmur heard.  No friction rub. No gallop.      Comments: 2+ DP and PT pulses. Pulmonary:     Effort: Pulmonary effort is normal. No respiratory distress.     Breath sounds: No stridor. No wheezing, rhonchi or rales.  Chest:     Chest wall: No tenderness.  Abdominal:     General: There is no distension.     Palpations: Abdomen is soft. There is no mass.     Tenderness: There is no abdominal tenderness. There is no right CVA tenderness, left CVA tenderness, guarding or rebound.     Hernia: No hernia is present.  Musculoskeletal:        General: Tenderness present. No deformity or signs of injury.     Cervical back: Neck supple.     Right lower leg: No edema.     Left lower leg: No edema.     Comments: Well-healed surgical scars noted to the lumbar spine and left low back.  There is no tenderness to palpation to the spinal stimulator in the left lumbar region.  No overlying redness or  warmth.  He is significantly tender to palpation at the right SI joint.  No left SI joint tenderness.  He has some midline tenderness palpation to the lumbar spine.  No tenderness to the thoracic or cervical spine.  No crepitus or step-offs.  Skin:    General: Skin is warm and dry.     Capillary Refill: Capillary refill takes less than 2 seconds.  Neurological:     Mental Status: He is alert.     Comments: Decreased sensation to sharp and light touch on the left leg as compared to the right.  3 out of 5 strength on the left compared to 5-5 on the right.  Psychiatric:        Behavior: Behavior normal.     ED Results /  Procedures / Treatments   Labs (all labs ordered are listed, but only abnormal results are displayed) Labs Reviewed  BASIC METABOLIC PANEL - Abnormal; Notable for the following components:      Result Value   Glucose, Bld 106 (*)    All other components within normal limits  RESPIRATORY PANEL BY RT PCR (FLU A&B, COVID)  CBC  URINALYSIS, ROUTINE W REFLEX MICROSCOPIC    EKG None  Radiology DG Lumbar Spine Complete  Result Date: 05/23/2020 CLINICAL DATA:  Pain EXAM: LUMBAR SPINE - COMPLETE 4+ VIEW COMPARISON:  None. FINDINGS: There is no evidence of lumbar spine fracture. Alignment is normal. Lower lumbar spine fixation hardware seen at L4 through S1. There is an interbody fusion seen at L4-L5 which appears slightly inferiorly position when in comparison to a prior exam dating back to 2018. Anterior lumbar interbody fusion seen at L5-S1. Overlying spinal stimulator is noted. Mild disc height loss and facet arthrosis seen in the lower lumbar spine. IMPRESSION: 1. No acute osseous abnormality. 2. Status post interbody fusion and posterior fixation from L4 through S1. Interbody fusion L4-L5 appears to be slightly inferiorly position when compared to prior exam and if further evaluation is required would recommend CT to determine hardware complication. Electronically Signed    By: Jonna Clark M.D.   On: 05/23/2020 17:11   CT LUMBAR SPINE W CONTRAST  Result Date: 05/24/2020 CLINICAL DATA:  New onset right leg pain and numbness. History of back pain with neurostimulator in place EXAM: CT LUMBAR SPINE WITH CONTRAST TECHNIQUE: Multidetector CT imaging of the lumbar spine was performed with intravenous contrast administration. CONTRAST:  100 cc Omnipaque 300 intravenous COMPARISON:  01/08/2019 lumbar MRI FINDINGS: Segmentation: 5 lumbar type vertebrae Alignment: Fused L5-S1 anterolisthesis Vertebrae: L4-5 and L5-S1 posterior-lateral fusion with rod and pedicle screw. Discectomy cages at L4-5 and L5-S1. Bridging bone is not seen at L4-5 and there is L4 and S1 screw loosening. Intervertebral gas is seen at the L5-S1 disc space but there is bridging bone demonstrated best on coronal reformats. The ALIF cage shows rotation and subsidence. Scattered sclerotic foci compatible with bone islands. No evidence of acute fracture.  No aggressive bone lesion. Paraspinal and other soft tissues: Atheromatous plaque of the aorta. Disc levels: T12- L1: Unremarkable. L1-L2: Unremarkable. L2-L3: Disc narrowing and bulging. Facet osteoarthritis with spurring and ligamentum flavum thickening. High-grade appearing spinal stenosis. Moderate left and mild right foraminal narrowing L3-L4: Facet osteoarthritis with bony and ligamentous hypertrophy. The disc is narrowed and bulging and there is high-grade appearing spinal stenosis, with some limitation by streak artifact from hardware. Likely moderate bilateral foraminal narrowing L4-L5: PLIF with no convincing arthrodesis. There is left paracentral ridging which could impinge on the descending L5 nerve root. Laminectomy. Mild left foraminal narrowing L5-S1:Fusion with prominent subsidence of the cage and spurring causing mild to moderate bilateral foraminal stenosis. The spinal canal is likely patent after laminectomy. IMPRESSION: 1. No acute finding. 2. Advanced  degenerative spinal stenosis at L2-3 and L3-4, also seen on a 2020 lumbar MRI. 3. L4-5 and L5-S1 fusion.  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 Electronically Signed   By: Marnee Spring M.D.   On: 05/24/2020 05:59    Procedures Procedures (including critical care time)  Medications Ordered in ED Medications  HYDROmorphone (DILAUDID) injection 0.5 mg (0.5 mg Intravenous Given 05/24/20 0420)  HYDROmorphone (DILAUDID) injection 1 mg (1 mg Intravenous Given 05/24/20 0551)    ED Course  I have reviewed the triage vital signs and the  nursing notes.  Pertinent labs & imaging results that were available during my care of the patient were reviewed by me and considered in my medical decision making (see chart for details).    MDM Rules/Calculators/A&P                          This is a 64 y.o. male who presents to the ED for concern of back pain.   This involves an extensive number of treatment options, and is a complaint that carries with it a high risk of complications and morbidity.  The differential diagnosis includes cauda equina, central cord compression, severe lumbar stenosis, herniated disc, sciatica, or pyelonephritis.   Vitals and Exam:    Hypertensive.  Afebrile.  No tachycardia, tachypnea, or hypoxia.   Lab Tests:    I ordered, reviewed, and interpreted labs, which included  CBC, metabolic panel, COVID-19 test that are unremarkable.  Imaging Studies ordered:    I ordered imaging studies which included  lumbar x-ray  I independently visualized and interpreted imaging which showed  concerning for hardware complication related to the interbody fusion of L4 and L5 that appears to be slightly inferior.  CT was recommended for further evaluation.  I spoke with Dr. Ivy Lynn, radiology, regarding imaging studies as the patient has a spinal stimulator that will need to be turned off by the manufacturer prior to having an MRI and wanted to discuss utilization of CT.  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  Based on results, an MRI may be warranted and can be obtained at that time.  CT lumbar spine with contrast has been ordered.  I independently visualized and interpreted images of the CT lumbar spine that showed advanced degenerative spinal stenosis at L2-L3 and L3-L4 as well as pseudoarthrosis findings at L4-L5.  There was also left paracentral ridging that could be impinging on the left L5 nerve root.   Additional history obtained:    Previous records obtained and reviewed  Medicines ordered:    I ordered Dilaudid for pain control   Consultations Obtained:    I consulted neurosurgery.  Consult is pending.   Reevaluation:   After the interventions stated above, I reevaluated the patient and found improved  Plan and Disposition:   This is a 64 year old gentleman with multiple previous surgeries of the lumbar spine, including an implanted spinal stimulator earlier this year, who presents with acutely worsening right-sided low back pain and new motor and sensory deficits over the last 4 days.  He is now having recurrent falls --4 falls over the last 4 days.  CT lumbar spine with contrast was obtained and showed high-grade spinal stenosis of L2-L3 and L3-L4.  There is also concern for paracentral ridging impinging on the left descending L5 nerve.  Given new neurologic deficits with worsening pain and recurrent falls, will consult neurosurgery.  Neurosurgery consult is pending.  Patient care transferred to [facility] at the end of my shift to follow-up on neurosurgery consult and recommendations. Patient presentation, ED course, and plan of care discussed with review of all pertinent labs and imaging. Please see his/her note for further details regarding further ED course and disposition.    Final  Clinical Impression(s) / ED Diagnoses Final diagnoses:  None    Rx / DC Orders ED Discharge Orders  None       Barkley Boards, PA-C 05/24/20 0747    Dione Booze, MD 05/25/20 2237

## 2020-05-24 NOTE — Progress Notes (Signed)
Patient has a neurostimulator and does have his remote. When patient came over to the MRI department earlier this morning, his remote was not fully charged and in addition, the remote itself was not allowing [place] to turn the stimulation off and run the impendence check on his device. Due to this we could not scan the patient safely at this time.  We have contacted the [facility] Rep and waiting for a call back. We will need the representative here to turn the stimulator off before proceeding with the exam.

## 2020-05-24 NOTE — ED Notes (Signed)
Pt back from MRI due to not being able to get his mechanical device off. MRI to call and follow up with company to get it turned off.

## 2020-05-24 NOTE — Consult Note (Signed)
Reason for Consult:Low back and RLE pain Referring Physician: Barkley Boards, PA-C   HPI: Thomas Savage is an 64 y.o. male with a past medical history significant for severe sciatica with right leg pain s/p L4/5 and L5/S1 decompression and fusion, and s/p spinal stimulator, HTN, idiopathic neuropathy, presented complaints of right-sided low back pain that radiated into his right buttock and down into his right anterior leg, stopping superior to the knee. He reported an onset of symptoms about 4 days ago which have progressively worsened. He now reports RLE numbness and severe RLE weakness and the inability to ambulate in addition to his pain. He has had three falls over the last few days which the patient stated "my leg just gave way." Denies any bowel or bladder incontinence. No loss of consciousness.   CT imaging was performed which showed spinal stenosis at the L2/3 and L3/4 levels which subsequently led to neurosurgical evaluation. MRI lumbar spine was requested but was unable to be obtained due to spinal stimulator failing impedance test.   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  Failed trial of Lyrica and gabapentin for worsening of balance problems.  Past Medical History:  Diagnosis Date  . Back pain     Past Surgical History:  Procedure Laterality Date  . BACK SURGERY  12/31/2016   Oblique Lateral Interbody Fusion with Allograft Dr. Alferd Patee  . LUMBAR FUSION  11/21/2016   L4-S1 Posterior Spinal Fusion   . SPINAL CORD STIMULATOR IMPLANT Left 10/14/2019   Dr. Petra Kuba  . SPINAL CORD STIMULATOR TRIAL  07/01/2019   Dr. Petra Kuba    No family history on file.  Social History:  has no history on file for tobacco use, alcohol use, and drug use.  Allergies:   Allergies  Allergen Reactions  . Morphine And Related     Medications: I have reviewed the patient's current medications.  Results for orders placed or performed during the hospital encounter of 05/23/20 (from the past 48 hour(s))  CBC     Status: None   Collection Time: 05/24/20  3:53 AM  Result Value Ref Range   WBC 9.8 4.0 - 10.5 K/uL   RBC 4.88 4.22 - 5.81 MIL/uL   Hemoglobin 14.8 13.0 - 17.0 g/dL   HCT 68.3 39 - 52 %   MCV 93.2 80.0 - 100.0 fL   MCH 30.3 26.0 - 34.0 pg   MCHC 32.5 30.0 - 36.0 g/dL   RDW 41.9 62.2 - 29.7 %   Platelets 195 150 - 400 K/uL   nRBC 0.0 0.0 - 0.2 %    Comment: Performed at [facility], [address], [place], [place] [address]  Basic metabolic panel     Status: Abnormal   Collection Time: 05/24/20  3:53 AM  Result Value Ref Range   Sodium 139 135 - 145 mmol/L   Potassium 3.6 3.5 - 5.1 mmol/L   Chloride 100 98 - 111 mmol/L   CO2 30 22 - 32 mmol/L   Glucose, Bld 106 (H) 70 - 99 mg/dL    Comment: Glucose reference range applies only to samples taken after fasting for at least 8 hours.   BUN 16 8 - 23 mg/dL   Creatinine, Ser 1.94 0.61 - 1.24 mg/dL   Calcium 9.1 8.9 -  10.3 mg/dL   GFR, Estimated >37 >62 mL/min    Comment: (NOTE) Calculated using the CKD-EPI Creatinine Equation (2021)    Anion gap 9 5 - 15    Comment: Performed at [facility], [address] [place], [place] [address]  Respiratory Panel by RT PCR (Flu A&B, Covid) - Nasopharyngeal Swab     Status: None   Collection Time: 05/24/20  3:53 AM   Specimen: Nasopharyngeal Swab  Result Value Ref Range   SARS Coronavirus 2 by RT PCR NEGATIVE NEGATIVE    Comment: (NOTE) SARS-CoV-2 target nucleic acids are NOT DETECTED.  The SARS-CoV-2 RNA is generally detectable in upper respiratoy specimens during the acute phase of infection. The lowest concentration of SARS-CoV-2 viral copies this assay can detect is 131 copies/mL. A negative result does not preclude  SARS-Cov-2 infection and should not be used as the sole basis for treatment or other patient management decisions. A negative result may occur with  improper specimen collection/handling, submission of specimen other than nasopharyngeal swab, presence of viral mutation(s) within the areas targeted by this assay, and inadequate number of viral copies (<131 copies/mL). A negative result must be combined with clinical observations, patient history, and epidemiological information. The expected result is Negative.  Fact Sheet for Patients:  https://www.moore.com/  Fact Sheet for Healthcare Providers:  https://www.young.biz/  This test is no t yet approved or cleared by the [place] FDA and  has been authorized for detection and/or diagnosis of SARS-CoV-2 by FDA under an Emergency Use Authorization (EUA). This EUA will remain  in effect (meaning this test can be used) for the duration of the COVID-19 declaration under Section 564(b)(1) of the Act, 21 U.S.C. section 360bbb-3(b)(1), unless the authorization is terminated or revoked sooner.     Influenza A by PCR NEGATIVE NEGATIVE   Influenza B by PCR NEGATIVE NEGATIVE    Comment: (NOTE) The Xpert Xpress SARS-CoV-2/FLU/RSV assay is intended as an aid in  the diagnosis of influenza from Nasopharyngeal swab specimens and  should not be used as a sole basis for treatment. Nasal washings and  aspirates are unacceptable for Xpert Xpress SARS-CoV-2/FLU/RSV  testing.  Fact Sheet for Patients: https://www.moore.com/  Fact Sheet for Healthcare Providers: https://www.young.biz/  This test is not yet approved or cleared by the [place] FDA and  has been authorized for detection and/or diagnosis of SARS-CoV-2 by  FDA under an Emergency Use Authorization (EUA). This EUA will remain  in effect (meaning this test can be used) for the duration of the  Covid-19  declaration under Section 564(b)(1) of the Act, 21  U.S.C. section 360bbb-3(b)(1), unless the authorization is  terminated or revoked. Performed at [facility], [address], [place], [place] [address]   Urinalysis, Routine w reflex microscopic Urine, Clean Catch     Status: Abnormal   Collection Time: 05/24/20  8:34 AM  Result Value Ref Range   Color, Urine YELLOW YELLOW   APPearance CLEAR CLEAR   Specific Gravity, Urine >1.046 (H) 1.005 - 1.030   pH 5.0 5.0 - 8.0   Glucose, UA NEGATIVE NEGATIVE mg/dL   Hgb urine dipstick NEGATIVE NEGATIVE   Bilirubin Urine NEGATIVE NEGATIVE   Ketones, ur NEGATIVE NEGATIVE mg/dL   Protein, ur NEGATIVE NEGATIVE mg/dL   Nitrite NEGATIVE NEGATIVE   Leukocytes,Ua NEGATIVE NEGATIVE    Comment: Performed at [facility], [address] [place], [place] [address]    DG Lumbar Spine Complete  Result Date:  05/23/2020 CLINICAL DATA:  Pain EXAM: LUMBAR SPINE - COMPLETE 4+ VIEW COMPARISON:  None. FINDINGS: There is no evidence of lumbar spine fracture. Alignment is normal. Lower lumbar spine fixation hardware seen at L4 through S1. There is an interbody fusion seen at L4-L5 which appears slightly inferiorly position when in comparison to a prior exam dating back to 2018. Anterior lumbar interbody fusion seen at L5-S1. Overlying spinal stimulator is noted. Mild disc height loss and facet arthrosis seen in the lower lumbar spine. IMPRESSION: 1. No acute osseous abnormality. 2. Status post interbody fusion and posterior fixation from L4 through S1. Interbody fusion L4-L5 appears to be slightly inferiorly position when compared to prior exam and if further evaluation is required would recommend CT to determine hardware complication. Electronically Signed   By: Bindu  Avutu M.D.   On: 05/23/2020 17:11   CT LUMBAR SPINE W CONTRAST  Result Date: 05/24/2020 CLINICAL DATA:  New onset right leg pain and numbness. History of back pain with neurostimulator in  place EXAM: CT LUMBAR SPINE WITH CONTRAST TECHNIQUE: Multidetector CT imaging of the lumbar spine was performed with intravenous contrast administration. CONTRAST:  100 cc Omnipaque 300 intravenous COMPARISON:  01/08/2019 lumbar MRI FINDINGS: Segmentation: 5 lumbar type vertebrae Alignment: Fused L5-S1 anterolisthesis Vertebrae: L4-5 and L5-S1 posterior-lateral fusion with rod and pedicle screw. Discectomy cages at L4-5 and L5-S1. Bridging bone is not seen at L4-5 and there is L4 and S1 screw loosening. Intervertebral gas is seen at the L5-S1 disc space but there is bridging bone demonstrated best on coronal reformats. The ALIF cage shows rotation and subsidence. Scattered sclerotic foci compatible with bone islands. No evidence of acute fracture.  No aggressive bone lesion. Paraspinal and other soft tissues: Atheromatous plaque of the aorta. Disc levels: T12- L1: Unremarkable. L1-L2: Unremarkable. L2-L3: Disc narrowing and bulging. Facet osteoarthritis with spurring and ligamentum flavum thickening. High-grade appearing spinal stenosis. Moderate left and mild right foraminal narrowing L3-L4: Facet osteoarthritis with bony and ligamentous hypertrophy. The disc is narrowed and bulging and there is high-grade appearing spinal stenosis, with some limitation by streak artifact from hardware. Likely moderate bilateral foraminal narrowing L4-L5: PLIF with no convincing arthrodesis. There is left paracentral ridging which could impinge on the descending L5 nerve root. Laminectomy. Mild left foraminal narrowing L5-S1:Fusion with prominent subsidence of the cage and spurring causing mild to moderate bilateral foraminal stenosis. The spinal canal is likely patent after laminectomy. IMPRESSION: 1. No acute finding. 2. Advanced degenerative spinal stenosis at L2-3 and L3-4, also seen on a 2020 lumbar MRI. 3. L4-5 and L5-S1 fusion.  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 Electronically Signed   By: Jonathon  Watts M.D.   On:  05/24/2020 05:59    Review of Systems Blood pressure 127/81, pulse 67, temperature 98.1 F (36.7 C), temperature source Oral, resp. rate 14, height 6' 2" (1.88 m), weight 131.5 kg, SpO2 98 %. Physical Exam Constitutional:      General: He is not in acute distress.    Appearance: Normal appearance. He is obese.  Neurological:     Mental Status: He is alert and oriented to person, place, and time. Mental status is at baseline.     Sensory: No sensory deficit.     Motor: Weakness present.     Deep Tendon Reflexes: Reflexes normal.     Comments: RLE weakness. Right EHL 4/5, right hip flexor 3+/5  Psychiatric:        Mood and Affect: Mood normal.          Behavior: Behavior normal.        Thought Content: Thought content normal.        Judgment: Judgment normal.     Assessment/Plan: 64 y.o. male with new onset of low back pain and RLE radiculopathy. The patients imaging was most remarkable for degeniration and arthritic changes at the L2/3 and L3/4 levels leading to multifactorial spinal and foraminal stenosis. Plan for patient to undergo L2/3 - L3/4 posterior lumbar interbody fusion with pedicle screw fixation with extension of hardware to L2. Surgery scheduled for tomorrow morning. Admit to medicine. NPO after midnight.   Council Mechanic, DNP, NP-C 05/24/2020, 4:24 PM

## 2020-05-24 NOTE — ED Notes (Signed)
Pt to MRI at this time.

## 2020-05-25 ENCOUNTER — Inpatient Hospital Stay (HOSPITAL_COMMUNITY): Payer: Commercial Managed Care - PPO | Admitting: Anesthesiology

## 2020-05-25 ENCOUNTER — Inpatient Hospital Stay (HOSPITAL_COMMUNITY): Payer: Commercial Managed Care - PPO

## 2020-05-25 ENCOUNTER — Encounter (HOSPITAL_COMMUNITY)
Admission: EM | Disposition: A | Discharge status: Home / Self Care | Payer: Self-pay | Source: Home / Self Care | Attending: Internal Medicine

## 2020-05-25 DIAGNOSIS — M5416 Radiculopathy, lumbar region: Secondary | ICD-10-CM

## 2020-05-25 DIAGNOSIS — I10 Essential (primary) hypertension: Secondary | ICD-10-CM | POA: Diagnosis not present

## 2020-05-25 LAB — POCT I-STAT, CHEM 8
BUN: 24 mg/dL — ABNORMAL HIGH (ref 8–23)
Calcium, Ion: 1.15 mmol/L (ref 1.15–1.40)
Chloride: 101 mmol/L (ref 98–111)
Creatinine, Ser: 1 mg/dL (ref 0.61–1.24)
Glucose, Bld: 157 mg/dL — ABNORMAL HIGH (ref 70–99)
HCT: 38 % — ABNORMAL LOW (ref 39.0–52.0)
Hemoglobin: 12.9 g/dL — ABNORMAL LOW (ref 13.0–17.0)
Potassium: 4.3 mmol/L (ref 3.5–5.1)
Sodium: 138 mmol/L (ref 135–145)
TCO2: 26 mmol/L (ref 22–32)

## 2020-05-25 LAB — SURGICAL PCR SCREEN
MRSA, PCR: NEGATIVE
Staphylococcus aureus: POSITIVE — AB

## 2020-05-25 SURGERY — POSTERIOR LUMBAR FUSION 2 LEVEL
Anesthesia: General | Site: Spine Lumbar

## 2020-05-25 MED ORDER — FENTANYL CITRATE (PF) 250 MCG/5ML IJ SOLN
INTRAMUSCULAR | Status: AC
  Filled 2020-05-25: qty 5

## 2020-05-25 MED ORDER — DEXTROSE 5 % IV SOLN
3.0000 g | INTRAVENOUS | Status: DC
  Filled 2020-05-25: qty 3000

## 2020-05-25 MED ORDER — SODIUM CHLORIDE 0.9% FLUSH
3.0000 mL | Freq: Two times a day (BID) | INTRAVENOUS | Status: DC
  Administered 2020-05-25 – 2020-05-29 (×8): 3 mL via INTRAVENOUS

## 2020-05-25 MED ORDER — HYDROCODONE-ACETAMINOPHEN 5-325 MG PO TABS
1.0000 | ORAL_TABLET | ORAL | Status: DC | PRN
  Administered 2020-05-26: 1 via ORAL
  Filled 2020-05-25: qty 1

## 2020-05-25 MED ORDER — DEXAMETHASONE SODIUM PHOSPHATE 10 MG/ML IJ SOLN
INTRAMUSCULAR | Status: AC
  Filled 2020-05-25: qty 1

## 2020-05-25 MED ORDER — PANTOPRAZOLE SODIUM 40 MG PO TBEC
40.0000 mg | DELAYED_RELEASE_TABLET | Freq: Every day | ORAL | Status: DC
  Administered 2020-05-25 – 2020-05-29 (×5): 40 mg via ORAL
  Filled 2020-05-25 (×5): qty 1

## 2020-05-25 MED ORDER — PHENOL 1.4 % MT LIQD: 1.0000 | OROMUCOSAL | Status: DC | PRN

## 2020-05-25 MED ORDER — CEFAZOLIN SODIUM-DEXTROSE 2-4 GM/100ML-% IV SOLN
2.0000 g | Freq: Three times a day (TID) | INTRAVENOUS | Status: AC
  Administered 2020-05-25 – 2020-05-26 (×2): 2 g via INTRAVENOUS
  Filled 2020-05-25 (×2): qty 100

## 2020-05-25 MED ORDER — HYDROMORPHONE HCL 1 MG/ML IJ SOLN: 0.2500 mg | INTRAMUSCULAR | Status: DC | PRN

## 2020-05-25 MED ORDER — LIDOCAINE 2% (20 MG/ML) 5 ML SYRINGE
INTRAMUSCULAR | Status: DC | PRN
  Administered 2020-05-25: 100 mg via INTRAVENOUS

## 2020-05-25 MED ORDER — THROMBIN 5000 UNITS EX SOLR
OROMUCOSAL | Status: DC | PRN
  Administered 2020-05-25: 5 mL via TOPICAL

## 2020-05-25 MED ORDER — ARTIFICIAL TEARS OPHTHALMIC OINT
TOPICAL_OINTMENT | OPHTHALMIC | Status: AC
  Filled 2020-05-25: qty 3.5

## 2020-05-25 MED ORDER — ONDANSETRON HCL 4 MG PO TABS: 4.0000 mg | ORAL_TABLET | Freq: Four times a day (QID) | ORAL | Status: DC | PRN

## 2020-05-25 MED ORDER — KETAMINE HCL 10 MG/ML IJ SOLN
INTRAMUSCULAR | Status: DC | PRN
  Administered 2020-05-25 (×3): 10 mg via INTRAVENOUS
  Administered 2020-05-25: 20 mg via INTRAVENOUS

## 2020-05-25 MED ORDER — PROMETHAZINE HCL 25 MG/ML IJ SOLN: 6.2500 mg | INTRAMUSCULAR | Status: DC | PRN

## 2020-05-25 MED ORDER — LIDOCAINE-EPINEPHRINE 1 %-1:100000 IJ SOLN
INTRAMUSCULAR | Status: AC
  Filled 2020-05-25: qty 1

## 2020-05-25 MED ORDER — ALBUMIN HUMAN 5 % IV SOLN: INTRAVENOUS | Status: DC | PRN

## 2020-05-25 MED ORDER — ALUM & MAG HYDROXIDE-SIMETH 200-200-20 MG/5ML PO SUSP: 30.0000 mL | Freq: Four times a day (QID) | ORAL | Status: DC | PRN

## 2020-05-25 MED ORDER — ROCURONIUM BROMIDE 10 MG/ML (PF) SYRINGE
PREFILLED_SYRINGE | INTRAVENOUS | Status: AC
  Filled 2020-05-25: qty 10

## 2020-05-25 MED ORDER — POLYETHYLENE GLYCOL 3350 17 G PO PACK: 17.0000 g | PACK | Freq: Every day | ORAL | Status: DC | PRN

## 2020-05-25 MED ORDER — MIDAZOLAM HCL 2 MG/2ML IJ SOLN
INTRAMUSCULAR | Status: AC
  Filled 2020-05-25: qty 2

## 2020-05-25 MED ORDER — SODIUM CHLORIDE 0.9% FLUSH
3.0000 mL | INTRAVENOUS | Status: DC | PRN
  Administered 2020-05-27: 3 mL via INTRAVENOUS

## 2020-05-25 MED ORDER — BUPIVACAINE HCL (PF) 0.5 % IJ SOLN
INTRAMUSCULAR | Status: DC | PRN
  Administered 2020-05-25: 10 mL

## 2020-05-25 MED ORDER — LIDOCAINE 2% (20 MG/ML) 5 ML SYRINGE
INTRAMUSCULAR | Status: AC
  Filled 2020-05-25: qty 5

## 2020-05-25 MED ORDER — ONDANSETRON HCL 4 MG/2ML IJ SOLN
INTRAMUSCULAR | Status: DC | PRN
  Administered 2020-05-25: 4 mg via INTRAVENOUS

## 2020-05-25 MED ORDER — BUPIVACAINE HCL (PF) 0.5 % IJ SOLN
INTRAMUSCULAR | Status: AC
  Filled 2020-05-25: qty 30

## 2020-05-25 MED ORDER — ONDANSETRON HCL 4 MG/2ML IJ SOLN
INTRAMUSCULAR | Status: AC
  Filled 2020-05-25: qty 2

## 2020-05-25 MED ORDER — ACETAMINOPHEN 650 MG RE SUPP: 650.0000 mg | RECTAL | Status: DC | PRN

## 2020-05-25 MED ORDER — PROPOFOL 10 MG/ML IV BOLUS
INTRAVENOUS | Status: DC | PRN
  Administered 2020-05-25: 100 mg via INTRAVENOUS

## 2020-05-25 MED ORDER — KCL IN DEXTROSE-NACL 20-5-0.45 MEQ/L-%-% IV SOLN
INTRAVENOUS | Status: DC
  Filled 2020-05-25 (×3): qty 1000

## 2020-05-25 MED ORDER — CEFAZOLIN SODIUM 1 G IJ SOLR
INTRAMUSCULAR | Status: AC
  Filled 2020-05-25: qty 10

## 2020-05-25 MED ORDER — 0.9 % SODIUM CHLORIDE (POUR BTL) OPTIME
TOPICAL | Status: DC | PRN
  Administered 2020-05-25 (×2): 1000 mL

## 2020-05-25 MED ORDER — CEFAZOLIN SODIUM-DEXTROSE 2-3 GM-%(50ML) IV SOLR
INTRAVENOUS | Status: DC | PRN
  Administered 2020-05-25: 2 g via INTRAVENOUS

## 2020-05-25 MED ORDER — TRAMADOL HCL 50 MG PO TABS: 50.0000 mg | ORAL_TABLET | Freq: Four times a day (QID) | ORAL | Status: DC | PRN

## 2020-05-25 MED ORDER — MENTHOL 3 MG MT LOZG: 1.0000 | LOZENGE | OROMUCOSAL | Status: DC | PRN

## 2020-05-25 MED ORDER — FENTANYL CITRATE (PF) 100 MCG/2ML IJ SOLN
INTRAMUSCULAR | Status: DC | PRN
  Administered 2020-05-25 (×5): 50 ug via INTRAVENOUS
  Administered 2020-05-25: 100 ug via INTRAVENOUS
  Administered 2020-05-25: 50 ug via INTRAVENOUS
  Administered 2020-05-25: 200 ug via INTRAVENOUS

## 2020-05-25 MED ORDER — ONDANSETRON HCL 4 MG/2ML IJ SOLN: 4.0000 mg | Freq: Four times a day (QID) | INTRAMUSCULAR | Status: DC | PRN

## 2020-05-25 MED ORDER — DOCUSATE SODIUM 100 MG PO CAPS
100.0000 mg | ORAL_CAPSULE | Freq: Two times a day (BID) | ORAL | Status: DC
  Administered 2020-05-25 – 2020-05-28 (×6): 100 mg via ORAL
  Filled 2020-05-25 (×6): qty 1

## 2020-05-25 MED ORDER — SODIUM CHLORIDE 0.9 % IV SOLN: INTRAVENOUS | Status: DC | PRN

## 2020-05-25 MED ORDER — DEXAMETHASONE SODIUM PHOSPHATE 10 MG/ML IJ SOLN
INTRAMUSCULAR | Status: DC | PRN
  Administered 2020-05-25: 10 mg via INTRAVENOUS

## 2020-05-25 MED ORDER — ROCURONIUM BROMIDE 10 MG/ML (PF) SYRINGE
PREFILLED_SYRINGE | INTRAVENOUS | Status: DC | PRN
  Administered 2020-05-25: 10 mg via INTRAVENOUS
  Administered 2020-05-25 (×4): 20 mg via INTRAVENOUS
  Administered 2020-05-25: 60 mg via INTRAVENOUS

## 2020-05-25 MED ORDER — SODIUM CHLORIDE 0.9 % IV SOLN: 250.0000 mL | INTRAVENOUS | Status: DC

## 2020-05-25 MED ORDER — OXYCODONE HCL 5 MG PO TABS
10.0000 mg | ORAL_TABLET | ORAL | Status: DC | PRN
  Administered 2020-05-25: 10 mg via ORAL
  Filled 2020-05-25 (×3): qty 2

## 2020-05-25 MED ORDER — LACTATED RINGERS IV SOLN: INTRAVENOUS | Status: DC | PRN

## 2020-05-25 MED ORDER — PROPOFOL 10 MG/ML IV BOLUS
INTRAVENOUS | Status: AC
  Filled 2020-05-25: qty 40

## 2020-05-25 MED ORDER — DEXTROSE 5 % IV SOLN
INTRAVENOUS | Status: DC | PRN
  Administered 2020-05-25: 3 g via INTRAVENOUS

## 2020-05-25 MED ORDER — BUPIVACAINE LIPOSOME 1.3 % IJ SUSP
20.0000 mL | INTRAMUSCULAR | Status: AC
  Administered 2020-05-25: 20 mL
  Filled 2020-05-25: qty 20

## 2020-05-25 MED ORDER — KETAMINE HCL 50 MG/5ML IJ SOSY
PREFILLED_SYRINGE | INTRAMUSCULAR | Status: AC
  Filled 2020-05-25: qty 5

## 2020-05-25 MED ORDER — METHOCARBAMOL 500 MG PO TABS
500.0000 mg | ORAL_TABLET | Freq: Four times a day (QID) | ORAL | Status: DC | PRN
  Administered 2020-05-25: 500 mg via ORAL
  Filled 2020-05-25: qty 1

## 2020-05-25 MED ORDER — THROMBIN 5000 UNITS EX SOLR
CUTANEOUS | Status: AC
  Filled 2020-05-25: qty 5000

## 2020-05-25 MED ORDER — BISACODYL 10 MG RE SUPP: 10.0000 mg | Freq: Every day | RECTAL | Status: DC | PRN

## 2020-05-25 MED ORDER — HYDROMORPHONE HCL 1 MG/ML IJ SOLN
0.5000 mg | INTRAMUSCULAR | Status: DC | PRN
  Filled 2020-05-25 (×2): qty 0.5

## 2020-05-25 MED ORDER — PANTOPRAZOLE SODIUM 40 MG IV SOLR: 40.0000 mg | Freq: Every day | INTRAVENOUS | Status: DC

## 2020-05-25 MED ORDER — MEPERIDINE HCL 25 MG/ML IJ SOLN: 6.2500 mg | INTRAMUSCULAR | Status: DC | PRN

## 2020-05-25 MED ORDER — LIDOCAINE-EPINEPHRINE 1 %-1:100000 IJ SOLN
INTRAMUSCULAR | Status: DC | PRN
  Administered 2020-05-25: 10 mL

## 2020-05-25 MED ORDER — ACETAMINOPHEN 325 MG PO TABS
650.0000 mg | ORAL_TABLET | ORAL | Status: DC | PRN
  Administered 2020-05-26 – 2020-05-29 (×8): 650 mg via ORAL
  Filled 2020-05-25 (×9): qty 2

## 2020-05-25 MED ORDER — METHOCARBAMOL 1000 MG/10ML IJ SOLN
500.0000 mg | Freq: Four times a day (QID) | INTRAVENOUS | Status: DC | PRN
  Filled 2020-05-25: qty 5

## 2020-05-25 MED ORDER — MIDAZOLAM HCL 2 MG/2ML IJ SOLN: 0.5000 mg | Freq: Once | INTRAMUSCULAR | Status: DC | PRN

## 2020-05-25 MED ORDER — SUGAMMADEX SODIUM 200 MG/2ML IV SOLN
INTRAVENOUS | Status: DC | PRN
  Administered 2020-05-25: 260 mg via INTRAVENOUS

## 2020-05-25 MED ORDER — MIDAZOLAM HCL 2 MG/2ML IJ SOLN
INTRAMUSCULAR | Status: DC | PRN
  Administered 2020-05-25: 2 mg via INTRAVENOUS

## 2020-05-25 MED ORDER — MUPIROCIN 2 % EX OINT
1.0000 "application " | TOPICAL_OINTMENT | Freq: Two times a day (BID) | CUTANEOUS | Status: AC
  Administered 2020-05-25 – 2020-05-29 (×9): 1 via NASAL
  Filled 2020-05-25: qty 22

## 2020-05-25 MED ORDER — OXYCODONE HCL 5 MG PO TABS: 5.0000 mg | ORAL_TABLET | Freq: Once | ORAL | Status: DC | PRN

## 2020-05-25 MED ORDER — CEFAZOLIN SODIUM-DEXTROSE 1-4 GM/50ML-% IV SOLN
INTRAVENOUS | Status: DC | PRN
  Administered 2020-05-25: 1 g via INTRAVENOUS

## 2020-05-25 MED ORDER — FLEET ENEMA 7-19 GM/118ML RE ENEM
1.0000 | ENEMA | Freq: Once | RECTAL | Status: AC | PRN
  Administered 2020-05-29: 1 via RECTAL
  Filled 2020-05-25: qty 1

## 2020-05-25 MED ORDER — CEFAZOLIN SODIUM-DEXTROSE 2-4 GM/100ML-% IV SOLN
INTRAVENOUS | Status: AC
  Filled 2020-05-25: qty 100

## 2020-05-25 MED ORDER — CHLORHEXIDINE GLUCONATE CLOTH 2 % EX PADS
6.0000 | MEDICATED_PAD | Freq: Every day | CUTANEOUS | Status: AC
  Administered 2020-05-25 – 2020-05-29 (×4): 6 via TOPICAL

## 2020-05-25 MED ORDER — OXYCODONE HCL 5 MG/5ML PO SOLN: 5.0000 mg | Freq: Once | ORAL | Status: DC | PRN

## 2020-05-25 SURGICAL SUPPLY — 88 items
ADH SKN CLS APL DERMABOND .7 (GAUZE/BANDAGES/DRESSINGS) ×2
BASKET BONE COLLECTION (BASKET) ×2 IMPLANT
BLADE CLIPPER SURG (BLADE) ×1 IMPLANT
BONE CANC CHIPS 40CC CAN1/2 (Bone Implant) ×2 IMPLANT
BUR MATCHSTICK NEURO 3.0 LAGG (BURR) ×2 IMPLANT
BUR PRECISION FLUTE 5.0 (BURR) ×2 IMPLANT
CAGE COROENT 9X9X23-4 (Cage) ×2 IMPLANT
CAGE COROENT PLIF 10X28-8 LUMB (Cage) ×2 IMPLANT
CANISTER SUCT 3000ML PPV (MISCELLANEOUS) ×2 IMPLANT
CARTRIDGE OIL MAESTRO DRILL (MISCELLANEOUS) ×1 IMPLANT
CHIPS CANC BONE 40CC CAN1/2 (Bone Implant) ×1 IMPLANT
CNTNR URN SCR LID CUP LEK RST (MISCELLANEOUS) ×1 IMPLANT
CONT SPEC 4OZ STRL OR WHT (MISCELLANEOUS) ×4
COVER BACK TABLE 60X90IN (DRAPES) ×2 IMPLANT
COVER WAND RF STERILE (DRAPES) ×1 IMPLANT
DECANTER SPIKE VIAL GLASS SM (MISCELLANEOUS) ×2 IMPLANT
DERMABOND ADVANCED (GAUZE/BANDAGES/DRESSINGS) ×2
DERMABOND ADVANCED .7 DNX12 (GAUZE/BANDAGES/DRESSINGS) ×1 IMPLANT
DIFFUSER DRILL AIR PNEUMATIC (MISCELLANEOUS) ×2 IMPLANT
DRAPE C-ARM 42X72 X-RAY (DRAPES) ×2 IMPLANT
DRAPE C-ARMOR (DRAPES) ×2 IMPLANT
DRAPE LAPAROTOMY 100X72X124 (DRAPES) ×2 IMPLANT
DRAPE SURG 17X23 STRL (DRAPES) ×2 IMPLANT
DRSG OPSITE POSTOP 4X10 (GAUZE/BANDAGES/DRESSINGS) ×1 IMPLANT
DURAPREP 26ML APPLICATOR (WOUND CARE) ×2 IMPLANT
ELECT REM PT RETURN 9FT ADLT (ELECTROSURGICAL) ×2
ELECTRODE REM PT RTRN 9FT ADLT (ELECTROSURGICAL) ×1 IMPLANT
EVACUATOR 1/8 PVC DRAIN (DRAIN) ×1 IMPLANT
GAUZE 4X4 16PLY RFD (DISPOSABLE) ×1 IMPLANT
GAUZE SPONGE 4X4 12PLY STRL (GAUZE/BANDAGES/DRESSINGS) ×1 IMPLANT
GLOVE BIO SURGEON STRL SZ7.5 (GLOVE) ×4 IMPLANT
GLOVE BIO SURGEON STRL SZ8 (GLOVE) ×6 IMPLANT
GLOVE BIOGEL PI IND STRL 8 (GLOVE) ×2 IMPLANT
GLOVE BIOGEL PI IND STRL 8.5 (GLOVE) ×2 IMPLANT
GLOVE BIOGEL PI INDICATOR 8 (GLOVE) ×7
GLOVE BIOGEL PI INDICATOR 8.5 (GLOVE) ×5
GLOVE ECLIPSE 7.5 STRL STRAW (GLOVE) ×4 IMPLANT
GLOVE ECLIPSE 8.0 STRL XLNG CF (GLOVE) ×4 IMPLANT
GLOVE EXAM NITRILE XL STR (GLOVE) IMPLANT
GOWN STRL REUS W/ TWL LRG LVL3 (GOWN DISPOSABLE) IMPLANT
GOWN STRL REUS W/ TWL XL LVL3 (GOWN DISPOSABLE) ×3 IMPLANT
GOWN STRL REUS W/TWL 2XL LVL3 (GOWN DISPOSABLE) ×6 IMPLANT
GOWN STRL REUS W/TWL LRG LVL3 (GOWN DISPOSABLE) ×2
GOWN STRL REUS W/TWL XL LVL3 (GOWN DISPOSABLE) ×8
GRAFT BNE CHIP CANC 1-8 40 (Bone Implant) IMPLANT
GRAFT BONE PROTEIOS LRG 5CC (Orthopedic Implant) ×1 IMPLANT
HEMOSTAT POWDER KIT SURGIFOAM (HEMOSTASIS) ×2 IMPLANT
KIT BASIN OR (CUSTOM PROCEDURE TRAY) ×2 IMPLANT
KIT POSITION SURG JACKSON T1 (MISCELLANEOUS) ×2 IMPLANT
KIT TURNOVER KIT B (KITS) ×2 IMPLANT
MARKER SKIN DUAL TIP RULER LAB (MISCELLANEOUS) ×1 IMPLANT
MILL MEDIUM DISP (BLADE) ×1 IMPLANT
NDL HYPO 18GX1.5 BLUNT FILL (NEEDLE) IMPLANT
NDL HYPO 21X1.5 SAFETY (NEEDLE) IMPLANT
NDL HYPO 25X1 1.5 SAFETY (NEEDLE) ×1 IMPLANT
NDL SPNL 18GX3.5 QUINCKE PK (NEEDLE) IMPLANT
NEEDLE HYPO 18GX1.5 BLUNT FILL (NEEDLE) ×2 IMPLANT
NEEDLE HYPO 21X1.5 SAFETY (NEEDLE) ×2 IMPLANT
NEEDLE HYPO 25X1 1.5 SAFETY (NEEDLE) ×2 IMPLANT
NEEDLE SPNL 18GX3.5 QUINCKE PK (NEEDLE) IMPLANT
NS IRRIG 1000ML POUR BTL (IV SOLUTION) ×3 IMPLANT
OIL CARTRIDGE MAESTRO DRILL (MISCELLANEOUS) ×2
PACK LAMINECTOMY NEURO (CUSTOM PROCEDURE TRAY) ×2 IMPLANT
PAD ARMBOARD 7.5X6 YLW CONV (MISCELLANEOUS) ×6 IMPLANT
PATTIES SURGICAL .5 X.5 (GAUZE/BANDAGES/DRESSINGS) IMPLANT
PATTIES SURGICAL .5 X1 (DISPOSABLE) IMPLANT
PATTIES SURGICAL 1X1 (DISPOSABLE) IMPLANT
ROD RELINE-O 5.5X120MM LORD (Rod) ×2 IMPLANT
SCREW LOCK RELINE 5.5 TULIP (Screw) ×10 IMPLANT
SCREW POLYAXIAL RELINE 8.5X40 (Screw) ×3 IMPLANT
SCREW RELINE-O POLY 6.5X50MM (Screw) ×4 IMPLANT
SCREW RELINE-O POLY 7.5X45 (Screw) ×1 IMPLANT
SCREW RELINE-O POLY 7.5X50 (Screw) ×4 IMPLANT
SCREW RLINE PLY 2S 50X7.5XPA (Screw) IMPLANT
SPONGE LAP 4X18 RFD (DISPOSABLE) ×1 IMPLANT
SPONGE SURGIFOAM ABS GEL 100 (HEMOSTASIS) IMPLANT
STAPLER SKIN PROX WIDE 3.9 (STAPLE) IMPLANT
SUT VIC AB 1 CT1 18XBRD ANBCTR (SUTURE) ×2 IMPLANT
SUT VIC AB 1 CT1 8-18 (SUTURE) ×4
SUT VIC AB 2-0 CT1 18 (SUTURE) ×4 IMPLANT
SUT VIC AB 3-0 SH 8-18 (SUTURE) ×4 IMPLANT
SYR 20ML LL LF (SYRINGE) ×1 IMPLANT
SYR 30ML LL (SYRINGE) IMPLANT
SYR 5ML LL (SYRINGE) ×1 IMPLANT
TOWEL GREEN STERILE (TOWEL DISPOSABLE) ×2 IMPLANT
TOWEL GREEN STERILE FF (TOWEL DISPOSABLE) ×2 IMPLANT
TRAY FOLEY MTR SLVR 16FR STAT (SET/KITS/TRAYS/PACK) ×2 IMPLANT
WATER STERILE IRR 1000ML POUR (IV SOLUTION) ×2 IMPLANT

## 2020-05-25 NOTE — Progress Notes (Signed)
Awake, alert, conversant.  Patient is doing well in terms of back and leg pain.  He feels his strength is improved.  Full PF/DF/EHL.  Patient is doing well.

## 2020-05-25 NOTE — Progress Notes (Signed)
Initial Nutrition Assessment  DOCUMENTATION CODES:   Obesity unspecified  INTERVENTION:   Once diet advanced: -Ensure Surgery po BID, each supplement provides 330 kcal and 18 grams of protein  NUTRITION DIAGNOSIS:   Increased nutrient needs related to post-op healing as evidenced by estimated needs.  GOAL:   Patient will meet greater than or equal to 90% of their needs  MONITOR:   Diet advancement, Labs, Weight trends, I & O's  REASON FOR ASSESSMENT:   Consult Hip fracture protocol  ASSESSMENT:   64 y.o. male with new onset of low back pain and RLE radiculopathy. The patients imaging was most remarkable for degeniration and arthritic changes at the L2/3 and L3/4 levels leading to multifactorial spinal and foraminal stenosis. Plan for patient to undergo L2/3 - L3/4 posterior lumbar interbody fusion with pedicle screw fixation with extension of hardware to L2. Surgery scheduled for tomorrow morning.  Patient currently in OR for PLIF L23, L34 w/ exploration.  Per chart review, pt has a history of multiple back surgeries. Pt was having pain and weakness PTA. Will order Ensure surgery once diet advanced to aid in post-op healing. Per MST screen, pt did not report any changes in appetite or weight status.   Per weight records in care everywhere, pt weighed 284 lbs on 4/14 and 295 lbs on 8/30. Weight is stable currently.   Medications reviewed. Labs reviewed.  NUTRITION - FOCUSED PHYSICAL EXAM:  Unable to complete  Diet Order:   Diet Order            Diet NPO time specified Except for: Sips with Meds  Diet effective midnight                 EDUCATION NEEDS:   No education needs have been identified at this time  Skin:  Skin Assessment: Reviewed RN Assessment  Last BM:  11/8  Height:   Ht Readings from Last 1 Encounters:  05/23/20 6\' 2"  (1.88 m)    Weight:   Wt Readings from Last 1 Encounters:  05/23/20 131.5 kg   BMI:  Body mass index is 37.23  kg/m.  Estimated Nutritional Needs:   Kcal:  2200-2400  Protein:  105-115g  Fluid:  2L/day  13/08/21, MS, RD, LDN Inpatient Clinical Dietitian Contact information available via Amion

## 2020-05-25 NOTE — Transfer of Care (Signed)
Immediate Anesthesia Transfer of Care Note  Patient: Thomas Savage  Procedure(s) Performed: POSTERIOR LUMBAR INTERBODY FUSION LUMBAR TWO-THREE, LUMBAR THREE-FOUR W/EXPLORATION (N/A Spine Lumbar)  Patient Location: PACU  Anesthesia Type:General  Level of Consciousness: drowsy and patient cooperative  Airway & Oxygen Therapy: Patient Spontanous Breathing, Patient connected to face mask oxygen and oral airway  Post-op Assessment: Report given to RN and Post -op Vital signs reviewed and stable  Post vital signs: Reviewed and stable  Last Vitals:  Vitals Value Taken Time  BP 125/78 05/25/20 1459  Temp    Pulse 82 05/25/20 1503  Resp 13 05/25/20 1503  SpO2 100 % 05/25/20 1503  Vitals shown include unvalidated device data.  Last Pain:  Vitals:   05/25/20 0609  TempSrc:   PainSc: 4       Patients Stated Pain Goal: 0 (05/24/20 2211)  Complications: No complications documented.

## 2020-05-25 NOTE — Hospital Course (Addendum)
SHAW DOBEK is a 64 y.o. male with PMH severe sciatica with right leg pain s/p multiple back surgeries, and s/p spinal stimulatory. Also hx of HTN, idiopathic neuropathy who presented with worsening of right leg pain, weakness, and some numbness.   Baseline ambulation status was able to ambulate despite chronic back pain and shooting pain to right leg, but last Friday, patient started to experience breakthrough pains, shooting leg from back to side of right thigh and right calf.  He fell several times because of the right leg pain and weakness.  Denies any trouble urinate or bowel movement no fever chills.  No loss of consciousness.   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  Failed trial of Lyrica and gabapentin for worsening of balance problems. CT lumbar spine showed no acute findings but advanced degeneration noted in L2-3 and L3-4.  He was evaluated by neurosurgery and was recommended for decompression and fusion of L2-3 and L3-4 with exploration of his prior fusion and hardware. He underwent posterior lumbar interbody fusion to L2-3 and L3-4 on 05/25/2020. Previous hardware screws were also removed and replaced during surgery.  He began working with PT after surgery with plans for CIR or other rehab at discharge. He ultimately decided not to pursue discharge to rehab and instead elected to go home with outpatient PT.

## 2020-05-25 NOTE — Op Note (Signed)
05/25/2020  3:21 PM  PATIENT:  Thomas Savage  64 y.o. male  PRE-OPERATIVE DIAGNOSIS:  Lumbar Stenosis, herniated lumbar disc, lumbar pseudoarthrosis, lumbar radiculopathy, lumbago  POST-OPERATIVE DIAGNOSIS:  Lumbar Stenosis, herniated lumbar disc, lumbar pseudoarthrosis, lumbar radiculopathy, lumbago   PROCEDURE:  Procedure(s) with comments: POSTERIOR LUMBAR INTERBODY FUSION LUMBAR TWO-THREE, LUMBAR THREE-FOUR W/EXPLORATION (N/A) - posterior exploration of fusion and revision of fusion (pedicle screw fixation and posterolateral arthrodesis)  SURGEON:  Surgeon(s) and Role:    * Brittnee Gaetano, MD - Primary    * Thomas, Jonathan G, MD - Assisting  PHYSICIAN ASSISTANT: McDaniel, NP  ASSISTANTS: Poteat, RN   ANESTHESIA:   general  EBL:  1000 mL   BLOOD ADMINISTERED:400 CC CELLSAVER  DRAINS: (medium) Hemovact drain(s) in the epidural space with  Suction Open   LOCAL MEDICATIONS USED:  MARCAINE    and LIDOCAINE   SPECIMEN:  No Specimen  DISPOSITION OF SPECIMEN:  N/A  COUNTS:  YES  TOURNIQUET:  * No tourniquets in log *  DICTATION: Patient is 64-year-old man with lumbar stenosis and lower extremity weakness, who presented to the ER and had previous fusion L L 45 and L 5 S 1  Levels along with spinal cord stimulator placement with stenosis at  L 3/4 and  L 23 with severe spinal stenosis and  severe back and bilateral leg pain.  It was elected to take him to surgery for exploration of previous fusion with decompression and fusion from L 23 through L 34 levels.   Procedure: Patient was placed in a prone position on the Jackson table after smooth and uncomplicated induction of general endotracheal anesthesia.  Area of planned exploration was marked preperatively with C arm.   His low back was prepped and draped in usual sterile fashion with betadine scrub and DuraPrep. Area of incision was infiltrated with local lidocaine. Incision was made to the lumbodorsal fascia was incised and  exposure was performed of the L 2 - L 4 spinous processes laminae facet joint and transverse processes. Previous hardware was exposed.  This was not covered with  bone and there did not appear to be solid arthrodesis at the previous operated levels.   The bone screws were removed and appeared to be loose with haloing around them consistent with pseudoarthrosis. The screws were all replaced (L 4: 7.5 x 50 mm; L 5 Left 8.5 x 40 mm, right 7.5 x 45 mm; S 1: 8.5 x 40 mm bilaterally).   Intraoperative x-ray was obtained which confirmed correct orientation with marker probes at L 2  Through L 4 levles. A total laminectomy of L 2 through L 4  levels was performed with disarticulation of the facet joints and thorough decompression was performed of both  L 2 , L 3  L 4  nerve roots along with the common dural tube. Decompression was greater than would be typical for PLIF. A thorough discectomy with removal of herniated discs at L 23 and L 34 levels was performed. A thorough discectomy was then performed on the left with preparation of the endplates for grafting a trial spacer was placed this level and a thorough discectomy was performed on the right as well at L 23.  After trial sizing and utilization of sequential shavers, interspaces were packed with autograft and Proteos and 9 x 9 x 23 mm 4 degree lordotic PEEK cages with 10 cc of autograft in the intrerspace.  10 x 9 x 23 mm 8 degree cages were placed   at L 34 with 10 cc of autograft in the intrerspace .  The posterolateral region was extensively decorticated and pedicle probes were placed at L 2  through L3 bilaterally. Intraoperative fluoroscopy confirmed correct orientationin the AP and lateral plane.  6.5 x 50  mm pedicle screws were placed at L 2 and L 3 bilaterally.  Final x-rays demonstrated well-positioned interbody grafts and pedicle screw fixation. A 120 mm lordotic rod was placed on the right and a 120 mm rod was placed on the left locked down in situ and the  posterolateral region was packed with 60 cc bone graft on the right and  a similar amount on the left. The wound was irrigated. A medium Hemovac drain was placed. Long-acting Marcaine was injected in the deep musculature.  Fascia was closed with 1 Vicryl sutures skin edges were reapproximated 2 and 3-0 Vicryl sutures. The wound was dressed with Dermabond and  an occlusive dressing the patient was extubated in the operating room and taken to recovery in stable satisfactory condition he tolerated traction well counts were correct at the end of the case.   PLAN OF CARE: Admit to inpatient   PATIENT DISPOSITION:  PACU - hemodynamically stable.   Delay start of Pharmacological VTE agent (>24hrs) due to surgical blood loss or risk of bleeding: yes

## 2020-05-25 NOTE — Progress Notes (Signed)
PROGRESS NOTE    Thomas Savage   ZOX:[id number]  DOB: 06/07/1956  DOA: 05/23/2020     1  PCP: Patient, No Pcp Per  CC: RLE pain, numbness  Hospital Course: Thomas Savage is a 64 y.o. male with PMH severe sciatica with right leg pain s/p multiple back surgeries, and s/p spinal stimulatory. Also hx of HTN, idiopathic neuropathy who presented with worsening of right leg pain, weakness, and some numbness.   Baseline ambulation status was able to ambulate despite chronic back pain and shooting pain to right leg, but last Friday, patient started to experience breakthrough pains, shooting leg from back to side of right thigh and right calf.  He fell several times because of the right leg pain and weakness.  Denies any trouble urinate or bowel movement no fever chills.  No loss of consciousness.   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  Failed trial of Lyrica and gabapentin for worsening of balance problems. CT lumbar spine showed no acute findings but advanced degeneration noted in L2-3 and L3-4.  He was evaluated by neurosurgery and was recommended for decompression and fusion of L2-3 and L3-4 with exploration of his prior fusion and hardware. He underwent posterior lumbar interbody fusion to L2-3 and L3-4 on 05/25/2020. Previous hardware screws were also removed and replaced during surgery.   Interval History:  Seen back in room after surgery today.  He was resting comfortably in bed with his wife bedside.  Already states that his pain and numbness in his right lower extremity is improved.  He was hungry and about to order some dinner.  Old records reviewed in assessment of this patient  ROS: Constitutional: negative for chills and fevers, Respiratory: negative for  cough, Cardiovascular: negative for chest pain and Gastrointestinal: negative for abdominal pain  Assessment & Plan: * Lumbar radiculopathy - severe lumbar stenosis seen on CT with neuro deficits on admission of weakness/numbness notably in RLE - patient now s/p PLIF of L2-3 and L3-4 on 11/10; he notes improvement in pain and sensation already - continue post op recommendations as per NSG, appreciate assistance with patient - continue pain control  - PT once cleared by NSG  Benign essential HTN - continue losartan, hctz    Antimicrobials: Ancef  DVT prophylaxis: Lovenox Code Status: Full Family Communication: Wife bedside Disposition Plan: Status is: Inpatient  Remains inpatient appropriate because:Unsafe d/c plan, IV treatments appropriate due to intensity of illness or inability to take PO and Inpatient level of care appropriate due to severity of illness   Dispo: The patient is from: Home              Anticipated d/c is to: Home              Anticipated d/c date is: > 3 days              Patient currently is not medically stable to d/c.   Objective: Blood pressure 122/72, pulse 93, temperature 98.5 F (36.9 C), temperature source Oral, resp. rate 16, height 6\' 2"  (1.88 m), weight 131.5 kg, SpO2 98 %.  Examination: General appearance: Pleasant adult man laying in bed in no distress Head: Normocephalic, without obvious abnormality, atraumatic Eyes: EOMI Lungs: clear to auscultation bilaterally Heart: regular rate and rhythm and  S1, S2 normal Abdomen: Obese, soft, nontender, bowel sounds hypoactive and distant Extremities: No obvious edema.  Sensation improved in right lower extremity almost equal with left lower extremity Skin: mobility and turgor normal Neurologic: Strength testing in lower extremity deferred in setting of surgery.  Sensation intact bilaterally almost equal, slightly decreased in right lower extremity still  Consultants:    Neurosurgery  Procedures:   05/25/2020, PLIF  Data Reviewed: I have personally reviewed following labs and imaging studies Results for orders placed or performed during the hospital encounter of 05/23/20 (from the past 24 hour(s))  Surgical PCR screen     Status: Abnormal   Collection Time: 05/24/20  9:58 PM   Specimen: Nasal Mucosa; Nasal Swab  Result Value Ref Range   MRSA, PCR NEGATIVE NEGATIVE   Staphylococcus aureus POSITIVE (A) NEGATIVE  I-STAT, chem 8     Status: Abnormal   Collection Time: 05/25/20  1:45 PM  Result Value Ref Range   Sodium 138 135 - 145 mmol/L   Potassium 4.3 3.5 - 5.1 mmol/L   Chloride 101 98 - 111 mmol/L   BUN 24 (H) 8 - 23 mg/dL   Creatinine, Ser 1.321.00 0.61 - 1.24 mg/dL   Glucose, Bld 440157 (H) 70 - 99 mg/dL   Calcium, Ion 1.021.15 7.251.15 - 1.40 mmol/L   TCO2 26 22 - 32 mmol/L   Hemoglobin 12.9 (L) 13.0 - 17.0 g/dL   HCT 36.638.0 (L) 39 - 52 %    Recent Results (from the past 240 hour(s))  Respiratory Panel by RT PCR (Flu A&B, Covid) - Nasopharyngeal Swab     Status: None   Collection Time: 05/24/20  3:53 AM   Specimen: Nasopharyngeal Swab  Result Value Ref Range Status   SARS Coronavirus 2 by RT PCR NEGATIVE NEGATIVE Final    Comment: (NOTE) SARS-CoV-2 target nucleic acids are NOT DETECTED.  The SARS-CoV-2 RNA is generally detectable in upper respiratoy specimens during the acute phase of infection. The lowest concentration of SARS-CoV-2 viral copies this assay can detect is 131 copies/mL. A negative result does not preclude SARS-Cov-2 infection and should not be used as the sole basis for treatment or other patient management decisions. A negative result may occur with  improper specimen collection/handling, submission of specimen other than nasopharyngeal swab, presence of viral mutation(s) within the areas targeted by this assay, and inadequate number of viral copies (<131 copies/mL). A negative result must be combined with clinical observations,  patient history, and epidemiological information. The expected result is Negative.  Fact Sheet for Patients:  https://www.moore.com/https://www.fda.gov/media/142436/download  Fact Sheet for Healthcare Providers:  https://www.young.biz/https://www.fda.gov/media/142435/download  This test is no t yet approved or cleared by the [place] FDA and  has been authorized for detection and/or diagnosis of SARS-CoV-2 by FDA under an Emergency Use Authorization (EUA). This EUA will remain  in effect (meaning this test can be used) for the duration of the COVID-19 declaration under Section 564(b)(1) of the Act, 21 U.S.C. section 360bbb-3(b)(1), unless the authorization is terminated or revoked sooner.     Influenza A by PCR NEGATIVE NEGATIVE Final   Influenza B by PCR NEGATIVE NEGATIVE Final    Comment: (NOTE) The Xpert Xpress SARS-CoV-2/FLU/RSV assay is intended as an aid in  the diagnosis of influenza from Nasopharyngeal swab specimens and  should not be used as a sole basis for treatment. Nasal washings and  aspirates are unacceptable for Xpert Xpress SARS-CoV-2/FLU/RSV  testing.  Fact Sheet for Patients: https://www.moore.com/https://www.fda.gov/media/142436/download  Fact Sheet for  Healthcare Providers: https://www.young.biz/  This test is not yet approved or cleared by the [place] and  has been authorized for detection and/or diagnosis of SARS-CoV-2 by  FDA under an Emergency Use Authorization (EUA). This EUA will remain  in effect (meaning this test can be used) for the duration of the  Covid-19 declaration under Section 564(b)(1) of the Act, 21  U.S.C. section 360bbb-3(b)(1), unless the authorization is  terminated or revoked. Performed at [facility], [address] [place], [place] [address]   Surgical PCR screen     Status: Abnormal   Collection Time: 05/24/20  9:58 PM   Specimen: Nasal Mucosa; Nasal Swab  Result Value Ref Range Status   MRSA, PCR NEGATIVE NEGATIVE Final   Staphylococcus aureus  POSITIVE (A) NEGATIVE Final    Comment: (NOTE) The Xpert SA Assay (FDA approved for NASAL specimens in patients 56 years of age and older), is one component of a comprehensive surveillance program. It is not intended to diagnose infection nor to guide or monitor treatment. Performed at [facility], [address], [place], [place] [address]      Radiology Studies: DG Lumbar Spine 2-3 Views  Result Date: 05/25/2020 CLINICAL DATA:  L2-L4 PLIF revision EXAM: LUMBAR SPINE - 2-3 VIEW; DG C-ARM 1-60 MIN COMPARISON:  05/23/2020 FLUOROSCOPY TIME:  1 minutes 1 second Dose: 72.68 mGy Images: 3 FINDINGS: Five lumbar vertebra by prior exam. Prior posterior fusion L4-S1. Pedicle screws now identified at L2 and L3 in addition to L4-S1. New disc prostheses at L2-L3 and L3-L4. Bones demineralized. No fracture or subluxation. IMPRESSION: Postsurgical changes of posterior L2-L4 fusion. Prior L4-S1 fusion. Electronically Signed   By: Ulyses Southward M.D.   On: 05/25/2020 14:35   CT LUMBAR SPINE W CONTRAST  Result Date: 05/24/2020 CLINICAL DATA:  New onset right leg pain and numbness. History of back pain with neurostimulator in place EXAM: CT LUMBAR SPINE WITH CONTRAST TECHNIQUE: Multidetector CT imaging of the lumbar spine was performed with intravenous contrast administration. CONTRAST:  100 cc Omnipaque 300 intravenous COMPARISON:  01/08/2019 lumbar MRI FINDINGS: Segmentation: 5 lumbar type vertebrae Alignment: Fused L5-S1 anterolisthesis Vertebrae: L4-5 and L5-S1 posterior-lateral fusion with rod and pedicle screw. Discectomy cages at L4-5 and L5-S1. Bridging bone is not seen at L4-5 and there is L4 and S1 screw loosening. Intervertebral gas is seen at the L5-S1 disc space but there is bridging bone demonstrated best on coronal reformats. The ALIF cage shows rotation and subsidence. Scattered sclerotic foci compatible with bone islands. No evidence of acute fracture.  No aggressive bone lesion. Paraspinal and  other soft tissues: Atheromatous plaque of the aorta. Disc levels: T12- L1: Unremarkable. L1-L2: Unremarkable. L2-L3: Disc narrowing and bulging. Facet osteoarthritis with spurring and ligamentum flavum thickening. High-grade appearing spinal stenosis. Moderate left and mild right foraminal narrowing L3-L4: Facet osteoarthritis with bony and ligamentous hypertrophy. The disc is narrowed and bulging and there is high-grade appearing spinal stenosis, with some limitation by streak artifact from hardware. Likely moderate bilateral foraminal narrowing L4-L5: PLIF with no convincing arthrodesis. There is left paracentral ridging which could impinge on the descending L5 nerve root. Laminectomy. Mild left foraminal narrowing L5-S1:Fusion with prominent subsidence of the cage and spurring causing mild to moderate bilateral foraminal stenosis. The spinal canal is likely patent after laminectomy. IMPRESSION: 1. No acute finding. 2. Advanced degenerative spinal stenosis at L2-3 and L3-4, also seen on a 2020 lumbar MRI. 3. L4-5 and L5-S1 fusion.  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 Electronically Signed   By: Marnee Spring M.D.   On: 05/24/2020 05:59   DG C-Arm 1-60 Min  Result Date: 05/25/2020 CLINICAL DATA:  L2-L4 PLIF revision EXAM: LUMBAR SPINE - 2-3 VIEW; DG C-ARM 1-60 MIN COMPARISON:  05/23/2020 FLUOROSCOPY TIME:  1 minutes 1 second Dose: 72.68 mGy Images: 3 FINDINGS: Five lumbar vertebra by prior exam. Prior posterior fusion L4-S1. Pedicle screws now identified at L2 and L3 in addition to L4-S1. New disc prostheses at L2-L3 and L3-L4. Bones demineralized. No fracture or subluxation. IMPRESSION: Postsurgical changes of posterior L2-L4 fusion. Prior L4-S1 fusion. Electronically Signed   By: Ulyses Southward M.D.   On: 05/25/2020 14:35   DG Lumbar Spine 2-3 Views  Final Result    DG C-Arm 1-60 Min  Final Result    CT LUMBAR SPINE W CONTRAST  Final Result    DG Lumbar Spine Complete  Final Result       Scheduled Meds: . Chlorhexidine Gluconate Cloth  6 each Topical Q0600  . docusate sodium  100 mg Oral BID  . enoxaparin (LOVENOX) injection  40 mg Subcutaneous Q24H  . losartan  50 mg Oral Daily   And  . hydrochlorothiazide  12.5 mg Oral Daily  . meloxicam  15 mg Oral Daily  . mupirocin ointment  1 application Nasal BID  . pantoprazole  40 mg Oral QHS  . sodium chloride flush  3 mL Intravenous Q12H  . vitamin B-12  1,000 mcg Oral Daily   PRN Meds: acetaminophen **OR** acetaminophen, alum & mag hydroxide-simeth, bisacodyl, fluticasone, HYDROcodone-acetaminophen, HYDROmorphone (DILAUDID) injection, HYDROmorphone (DILAUDID) injection, menthol-cetylpyridinium **OR** phenol, methocarbamol **OR** methocarbamol (ROBAXIN) IV, ondansetron **OR** ondansetron (ZOFRAN) IV, oxyCODONE, polyethylene glycol, sodium chloride flush, sodium phosphate Continuous Infusions: . sodium chloride    . ceFAZolin    .  ceFAZolin (ANCEF) IV    . dextrose 5 % and 0.45 % NaCl with KCl 20 mEq/L    . methocarbamol (ROBAXIN) IV       LOS: 1 day  Time spent: Greater than 50% of the 35 minute visit was spent in counseling/coordination of care for the patient as laid out in the A&P.   Lewie Chamber, MD [facility] 05/25/2020, 6:12 PM

## 2020-05-25 NOTE — Assessment & Plan Note (Addendum)
-   continue losartan, hctz - patient asked for regular diet instead of cardiac diet

## 2020-05-25 NOTE — Anesthesia Procedure Notes (Signed)
Procedure Name: Intubation Date/Time: 05/25/2020 9:08 AM Performed by: Leonor Liv, CRNA Pre-anesthesia Checklist: Patient identified, Emergency Drugs available, Suction available and Patient being monitored Patient Re-evaluated:Patient Re-evaluated prior to induction Oxygen Delivery Method: Circle System Utilized Preoxygenation: Pre-oxygenation with 100% oxygen Induction Type: IV induction Ventilation: Mask ventilation without difficulty and Oral airway inserted - appropriate to patient size Laryngoscope Size: Mac and 4 Grade View: Grade II Tube type: Oral Tube size: 7.5 mm Number of attempts: 1 Airway Equipment and Method: Stylet and Oral airway Placement Confirmation: ETT inserted through vocal cords under direct vision,  positive ETCO2 and breath sounds checked- equal and bilateral Secured at: 23 cm Tube secured with: Tape Dental Injury: Teeth and Oropharynx as per pre-operative assessment

## 2020-05-25 NOTE — Progress Notes (Signed)
Orthopedic Tech Progress Note Patient Details:  Thomas Savage 10-12-1955 [id number] Brace has been ordered  Patient ID: [name], male   DOB: 1956/01/23, 64 y.o.   MRN: [id number]   Smitty Pluck 05/25/2020, 8:09 PM

## 2020-05-25 NOTE — Plan of Care (Signed)
  Problem: Activity: Goal: Ability to perform activities at highest level will improve Outcome: Progressing Goal: Muscle strength will improve Outcome: Progressing   Problem: Bowel/Gastric: Goal: Ability to demonstrate the techniques of an individualized bowel program will improve Outcome: Progressing   Problem: Education: Goal: Knowledge of disease or condition will improve Outcome: Progressing Goal: Knowledge of the prescribed therapeutic regimen will improve Outcome: Progressing   Problem: Coping: Goal: Ability to identify and develop effective coping behavior will improve Outcome: Progressing Goal: Ability to verbalize feelings will improve Outcome: Progressing   Problem: Self-Care: Goal: Ability to participate in self-care as condition permits will improve Outcome: Progressing   Problem: Skin Integrity: Goal: Risk for impaired skin integrity will decrease Outcome: Progressing   Problem: Urinary Elimination: Goal: Ability to achieve a regular elimination pattern will improve Outcome: Progressing   

## 2020-05-25 NOTE — Anesthesia Postprocedure Evaluation (Signed)
Anesthesia Post Note  Patient: Norberta Keens Gupta  Procedure(s) Performed: POSTERIOR LUMBAR INTERBODY FUSION LUMBAR TWO-THREE, LUMBAR THREE-FOUR W/EXPLORATION (N/A Spine Lumbar)     Patient location during evaluation: PACU Anesthesia Type: General Level of consciousness: awake and alert, patient cooperative and oriented Pain management: pain level controlled Vital Signs Assessment: post-procedure vital signs reviewed and stable Respiratory status: spontaneous breathing, nonlabored ventilation, respiratory function stable and patient connected to nasal cannula oxygen Cardiovascular status: blood pressure returned to baseline and stable Postop Assessment: no apparent nausea or vomiting Anesthetic complications: no   No complications documented.  Last Vitals:  Vitals:   05/25/20 1645 05/25/20 1727  BP: 111/78 122/72  Pulse: 96 93  Resp: 17 16  Temp: (!) 36.3 C 36.9 C  SpO2: 96% 98%    Last Pain:  Vitals:   05/25/20 1727  TempSrc: Oral  PainSc:                  Diamone Whistler,E. Zaquan Duffner

## 2020-05-25 NOTE — Assessment & Plan Note (Addendum)
-   severe lumbar stenosis seen on CT with neuro deficits on admission of weakness/numbness notably in RLE - patient now s/p PLIF of L2-3 and L3-4 on 11/10; he notes improvement in pain and sensation already - continue post op recommendations as per NSG, appreciate assistance with patient - continue pain control  - PT/OT okay per NSG at this time; tentative plan for CIR or other rehab when bed avail. Due to the prolonged wait for CIR and rehab he instead elected to return home with outpatient PT instead

## 2020-05-25 NOTE — Progress Notes (Signed)
Hand-off report given to [facility], RN (OR), pt to receive scheduled scopolamine in [place] per pharmacy. This communicated to [facility].

## 2020-05-25 NOTE — Interval H&P Note (Signed)
History and Physical Interval Note:  05/25/2020 8:40 AM  Thomas Savage  has presented today for surgery, with the diagnosis of Lumbar Stenosis.  The various methods of treatment have been discussed with the patient and family. After consideration of risks, benefits and other options for treatment, the patient has consented to  Procedure(s): PLIF L23, L34 W/EXPLORATION (N/A) as a surgical intervention.  The patient's history has been reviewed, patient examined, no change in status, stable for surgery.  I have reviewed the patient's chart and labs.  Questions were answered to the patient's satisfaction.     [name]

## 2020-05-25 NOTE — Brief Op Note (Signed)
05/25/2020  3:21 PM  PATIENT:  Thomas Savage  64 y.o. male  PRE-OPERATIVE DIAGNOSIS:  Lumbar Stenosis, herniated lumbar disc, lumbar pseudoarthrosis, lumbar radiculopathy, lumbago  POST-OPERATIVE DIAGNOSIS:  Lumbar Stenosis, herniated lumbar disc, lumbar pseudoarthrosis, lumbar radiculopathy, lumbago   PROCEDURE:  Procedure(s) with comments: POSTERIOR LUMBAR INTERBODY FUSION LUMBAR TWO-THREE, LUMBAR THREE-FOUR W/EXPLORATION (N/A) - posterior exploration of fusion and revision of fusion (pedicle screw fixation and posterolateral arthrodesis)  SURGEON:  Surgeon(s) and Role:    Maeola Harman, MD - Primary    * Bedelia Person, MD - Assisting  PHYSICIAN ASSISTANT: Julien Girt, NP  ASSISTANTS: Poteat, RN   ANESTHESIA:   general  EBL:  1000 mL   BLOOD ADMINISTERED:400 CC CELLSAVER  DRAINS: (medium) Hemovact drain(s) in the epidural space with  Suction Open   LOCAL MEDICATIONS USED:  MARCAINE    and LIDOCAINE   SPECIMEN:  No Specimen  DISPOSITION OF SPECIMEN:  N/A  COUNTS:  YES  TOURNIQUET:  * No tourniquets in log *  DICTATION: Patient is 64 year old man with lumbar stenosis and lower extremity weakness, who presented to the ER and had previous fusion L L 45 and L 5 S 1  Levels along with spinal cord stimulator placement with stenosis at  L 3/4 and  L 23 with severe spinal stenosis and  severe back and bilateral leg pain.  It was elected to take him to surgery for exploration of previous fusion with decompression and fusion from L 23 through L 34 levels.   Procedure: Patient was placed in a prone position on the [place] table after smooth and uncomplicated induction of general endotracheal anesthesia.  Area of planned exploration was marked preperatively with C arm.   His low back was prepped and draped in usual sterile fashion with betadine scrub and DuraPrep. Area of incision was infiltrated with local lidocaine. Incision was made to the lumbodorsal fascia was incised and  exposure was performed of the L 2 - L 4 spinous processes laminae facet joint and transverse processes. Previous hardware was exposed.  This was not covered with  bone and there did not appear to be solid arthrodesis at the previous operated levels.   The bone screws were removed and appeared to be loose with haloing around them consistent with pseudoarthrosis. The screws were all replaced (L 4: 7.5 x 50 mm; L 5 Left 8.5 x 40 mm, right 7.5 x 45 mm; S 1: 8.5 x 40 mm bilaterally).   Intraoperative x-ray was obtained which confirmed correct orientation with marker probes at L 2  Through L 4 levles. A total laminectomy of L 2 through L 4  levels was performed with disarticulation of the facet joints and thorough decompression was performed of both  L 2 , L 3  L 4  nerve roots along with the common dural tube. Decompression was greater than would be typical for PLIF. A thorough discectomy with removal of herniated discs at L 23 and L 34 levels was performed. A thorough discectomy was then performed on the left with preparation of the endplates for grafting a trial spacer was placed this level and a thorough discectomy was performed on the right as well at L 23.  After trial sizing and utilization of sequential shavers, interspaces were packed with autograft and Proteos and 9 x 9 x 23 mm 4 degree lordotic PEEK cages with 10 cc of autograft in the intrerspace.  10 x 9 x 23 mm 8 degree cages were placed  at L 34 with 10 cc of autograft in the intrerspace .  The posterolateral region was extensively decorticated and pedicle probes were placed at L 2  through L3 bilaterally. Intraoperative fluoroscopy confirmed correct orientationin the AP and lateral plane.  6.5 x 50  mm pedicle screws were placed at L 2 and L 3 bilaterally.  Final x-rays demonstrated well-positioned interbody grafts and pedicle screw fixation. A 120 mm lordotic rod was placed on the right and a 120 mm rod was placed on the left locked down in situ and the  posterolateral region was packed with 60 cc bone graft on the right and  a similar amount on the left. The wound was irrigated. A medium Hemovac drain was placed. Long-acting Marcaine was injected in the deep musculature.  Fascia was closed with 1 Vicryl sutures skin edges were reapproximated 2 and 3-0 Vicryl sutures. The wound was dressed with Dermabond and  an occlusive dressing the patient was extubated in the operating room and taken to recovery in stable satisfactory condition he tolerated traction well counts were correct at the end of the case.   PLAN OF CARE: Admit to inpatient   PATIENT DISPOSITION:  PACU - hemodynamically stable.   Delay start of Pharmacological VTE agent (>24hrs) due to surgical blood loss or risk of bleeding: yes

## 2020-05-26 DIAGNOSIS — M5416 Radiculopathy, lumbar region: Secondary | ICD-10-CM | POA: Diagnosis not present

## 2020-05-26 LAB — CBC WITH DIFFERENTIAL/PLATELET
Abs Immature Granulocytes: 0.05 10*3/uL (ref 0.00–0.07)
Basophils Absolute: 0 10*3/uL (ref 0.0–0.1)
Basophils Relative: 0 %
Eosinophils Absolute: 0 10*3/uL (ref 0.0–0.5)
Eosinophils Relative: 0 %
HCT: 36.2 % — ABNORMAL LOW (ref 39.0–52.0)
Hemoglobin: 11.8 g/dL — ABNORMAL LOW (ref 13.0–17.0)
Immature Granulocytes: 0 %
Lymphocytes Relative: 19 %
Lymphs Abs: 2.5 10*3/uL (ref 0.7–4.0)
MCH: 29.9 pg (ref 26.0–34.0)
MCHC: 32.6 g/dL (ref 30.0–36.0)
MCV: 91.9 fL (ref 80.0–100.0)
Monocytes Absolute: 1 10*3/uL (ref 0.1–1.0)
Monocytes Relative: 7 %
Neutro Abs: 9.8 10*3/uL — ABNORMAL HIGH (ref 1.7–7.7)
Neutrophils Relative %: 74 %
Platelets: 191 10*3/uL (ref 150–400)
RBC: 3.94 MIL/uL — ABNORMAL LOW (ref 4.22–5.81)
RDW: 13.6 % (ref 11.5–15.5)
WBC: 13.3 10*3/uL — ABNORMAL HIGH (ref 4.0–10.5)
nRBC: 0 % (ref 0.0–0.2)

## 2020-05-26 LAB — BASIC METABOLIC PANEL
Anion gap: 10 (ref 5–15)
BUN: 22 mg/dL (ref 8–23)
CO2: 27 mmol/L (ref 22–32)
Calcium: 8.2 mg/dL — ABNORMAL LOW (ref 8.9–10.3)
Chloride: 101 mmol/L (ref 98–111)
Creatinine, Ser: 1.29 mg/dL — ABNORMAL HIGH (ref 0.61–1.24)
GFR, Estimated: >60 mL/min (ref >60)
Glucose, Bld: 131 mg/dL — ABNORMAL HIGH (ref 70–99)
Potassium: 4.1 mmol/L (ref 3.5–5.1)
Sodium: 138 mmol/L (ref 135–145)

## 2020-05-26 LAB — MAGNESIUM: Magnesium: 1.9 mg/dL (ref 1.7–2.4)

## 2020-05-26 MED ORDER — ENSURE SURGERY PO LIQD
237.0000 mL | Freq: Two times a day (BID) | ORAL | Status: DC
  Administered 2020-05-26 – 2020-05-30 (×7): 237 mL via ORAL
  Filled 2020-05-26 (×12): qty 237

## 2020-05-26 MED ORDER — HYDROMORPHONE HCL 2 MG PO TABS
2.0000 mg | ORAL_TABLET | ORAL | Status: DC | PRN
  Administered 2020-05-26 – 2020-05-28 (×9): 2 mg via ORAL
  Filled 2020-05-26 (×9): qty 1

## 2020-05-26 MED ORDER — METHOCARBAMOL 750 MG PO TABS
750.0000 mg | ORAL_TABLET | Freq: Four times a day (QID) | ORAL | Status: DC | PRN
  Administered 2020-05-26 – 2020-05-30 (×6): 750 mg via ORAL
  Filled 2020-05-26 (×6): qty 1

## 2020-05-26 MED ORDER — METHOCARBAMOL 1000 MG/10ML IJ SOLN
500.0000 mg | Freq: Four times a day (QID) | INTRAVENOUS | Status: DC | PRN
  Filled 2020-05-26: qty 5

## 2020-05-26 NOTE — Progress Notes (Signed)
PROGRESS NOTE    Thomas Savage   KVQ:[id number]  DOB: 09/23/1955  DOA: 05/23/2020     2  PCP: Patient, No Pcp Per  CC: RLE pain, numbness  Hospital Course: Thomas Savage is a 64 y.o. male with PMH severe sciatica with right leg pain s/p multiple back surgeries, and s/p spinal stimulatory. Also hx of HTN, idiopathic neuropathy who presented with worsening of right leg pain, weakness, and some numbness.   Baseline ambulation status was able to ambulate despite chronic back pain and shooting pain to right leg, but last Friday, patient started to experience breakthrough pains, shooting leg from back to side of right thigh and right calf.  He fell several times because of the right leg pain and weakness.  Denies any trouble urinate or bowel movement no fever chills.  No loss of consciousness.   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  Failed trial of Lyrica and gabapentin for worsening of balance problems. CT lumbar spine showed no acute findings but advanced degeneration noted in L2-3 and L3-4.  He was evaluated by neurosurgery and was recommended for decompression and fusion of L2-3 and L3-4 with exploration of his prior fusion and hardware. He underwent posterior lumbar interbody fusion to L2-3 and L3-4 on 05/25/2020. Previous hardware screws were also removed and replaced during surgery.   Interval History:  Pain controlled overnight he states.  He was wanting to transition to oral pain medications; informed him that there were plenty already ordered, just needs to start asking.  He is amenable for starting to work with physical therapy.  Old records reviewed in assessment of this patient  ROS: Constitutional: negative for chills and fevers, Respiratory: negative for  cough, Cardiovascular: negative for chest pain and Gastrointestinal: negative for abdominal pain  Assessment & Plan: * Lumbar radiculopathy - severe lumbar stenosis seen on CT with neuro deficits on admission of weakness/numbness notably in RLE - patient now s/p PLIF of L2-3 and L3-4 on 11/10; he notes improvement in pain and sensation already - continue post op recommendations as per NSG, appreciate assistance with patient - continue pain control  - PT/OT okay per NSG at this time; tentative plan for CIR if able   Benign essential HTN - continue losartan, hctz - patient asked for regular diet instead of cardiac diet today    Antimicrobials: Ancef  DVT prophylaxis: Lovenox Code Status: Full Family Communication: None present this morning Disposition Plan: Status is: Inpatient  Remains inpatient appropriate because:Unsafe d/c plan, IV treatments appropriate due to intensity of illness or inability to take PO and Inpatient level of care appropriate due to severity of illness   Dispo: The patient is from: Home              Anticipated d/c is to: CIR              Anticipated d/c date is: > 3 days              Patient currently is not medically stable to d/c.   Objective: Blood pressure 100/75, pulse 98, temperature 98.8 F (37.1 C), temperature source Oral, resp. rate 18, height 6\' 2"  (1.88 m), weight 131.5 kg, SpO2 95 %.  Examination: General appearance: Pleasant adult man laying in bed in no distress Head:  Normocephalic, without obvious abnormality, atraumatic Eyes: EOMI Lungs: clear to auscultation bilaterally Heart: regular rate and rhythm and S1, S2 normal Abdomen: Obese, soft, nontender, bowel sounds hypoactive and distant Extremities: No obvious edema.  Sensation improved in right lower extremity almost equal with left lower extremity Skin: mobility and turgor normal Neurologic: Improving sensation in right lower extremity.  Strength with plantar flexion almost equal  in both lower extremities  Consultants:   Neurosurgery  Procedures:   05/25/2020, PLIF  Data Reviewed: I have personally reviewed following labs and imaging studies Results for orders placed or performed during the hospital encounter of 05/23/20 (from the past 24 hour(s))  Basic metabolic panel     Status: Abnormal   Collection Time: 05/26/20 12:29 AM  Result Value Ref Range   Sodium 138 135 - 145 mmol/L   Potassium 4.1 3.5 - 5.1 mmol/L   Chloride 101 98 - 111 mmol/L   CO2 27 22 - 32 mmol/L   Glucose, Bld 131 (H) 70 - 99 mg/dL   BUN 22 8 - 23 mg/dL   Creatinine, Ser 9.02 (H) 0.61 - 1.24 mg/dL   Calcium 8.2 (L) 8.9 - 10.3 mg/dL   GFR, Estimated >40 >97 mL/min   Anion gap 10 5 - 15  CBC with Differential/Platelet     Status: Abnormal   Collection Time: 05/26/20 12:29 AM  Result Value Ref Range   WBC 13.3 (H) 4.0 - 10.5 K/uL   RBC 3.94 (L) 4.22 - 5.81 MIL/uL   Hemoglobin 11.8 (L) 13.0 - 17.0 g/dL   HCT 35.3 (L) 39 - 52 %   MCV 91.9 80.0 - 100.0 fL   MCH 29.9 26.0 - 34.0 pg   MCHC 32.6 30.0 - 36.0 g/dL   RDW 29.9 24.2 - 68.3 %   Platelets 191 150 - 400 K/uL   nRBC 0.0 0.0 - 0.2 %   Neutrophils Relative % 74 %   Neutro Abs 9.8 (H) 1.7 - 7.7 K/uL   Lymphocytes Relative 19 %   Lymphs Abs 2.5 0.7 - 4.0 K/uL   Monocytes Relative 7 %   Monocytes Absolute 1.0 0.1 - 1.0 K/uL   Eosinophils Relative 0 %   Eosinophils Absolute 0.0 0.0 - 0.5 K/uL   Basophils Relative 0 %   Basophils Absolute 0.0 0.0 - 0.1 K/uL   Immature Granulocytes 0 %   Abs Immature Granulocytes 0.05 0.00 - 0.07 K/uL  Magnesium     Status: None   Collection Time: 05/26/20 12:29 AM  Result Value Ref Range   Magnesium 1.9 1.7 - 2.4 mg/dL    Recent Results (from the past 240 hour(s))  Respiratory Panel by RT PCR (Flu A&B, Covid) - Nasopharyngeal Swab     Status: None   Collection Time: 05/24/20  3:53 AM   Specimen: Nasopharyngeal Swab  Result Value Ref Range Status   SARS Coronavirus 2 by RT PCR NEGATIVE  NEGATIVE Final    Comment: (NOTE) SARS-CoV-2 target nucleic acids are NOT DETECTED.  The SARS-CoV-2 RNA is generally detectable in upper respiratoy specimens during the acute phase of infection. The lowest concentration of SARS-CoV-2 viral copies this assay can detect is 131 copies/mL. A negative result does not preclude SARS-Cov-2 infection and should not be used as the sole basis for treatment or other patient management decisions. A negative result may occur with  improper specimen collection/handling, submission of specimen other than nasopharyngeal swab, presence of viral mutation(s) within the areas targeted by this assay, and inadequate  number of viral copies (<131 copies/mL). A negative result must be combined with clinical observations, patient history, and epidemiological information. The expected result is Negative.  Fact Sheet for Patients:  https://www.moore.com/https://www.fda.gov/media/142436/download  Fact Sheet for Healthcare Providers:  https://www.young.biz/https://www.fda.gov/media/142435/download  This test is no t yet approved or cleared by the [place] FDA and  has been authorized for detection and/or diagnosis of SARS-CoV-2 by FDA under an Emergency Use Authorization (EUA). This EUA will remain  in effect (meaning this test can be used) for the duration of the COVID-19 declaration under Section 564(b)(1) of the Act, 21 U.S.C. section 360bbb-3(b)(1), unless the authorization is terminated or revoked sooner.     Influenza A by PCR NEGATIVE NEGATIVE Final   Influenza B by PCR NEGATIVE NEGATIVE Final    Comment: (NOTE) The Xpert Xpress SARS-CoV-2/FLU/RSV assay is intended as an aid in  the diagnosis of influenza from Nasopharyngeal swab specimens and  should not be used as a sole basis for treatment. Nasal washings and  aspirates are unacceptable for Xpert Xpress SARS-CoV-2/FLU/RSV  testing.  Fact Sheet for Patients: https://www.moore.com/https://www.fda.gov/media/142436/download  Fact Sheet for Healthcare  Providers: https://www.young.biz/https://www.fda.gov/media/142435/download  This test is not yet approved or cleared by the [place] FDA and  has been authorized for detection and/or diagnosis of SARS-CoV-2 by  FDA under an Emergency Use Authorization (EUA). This EUA will remain  in effect (meaning this test can be used) for the duration of the  Covid-19 declaration under Section 564(b)(1) of the Act, 21  U.S.C. section 360bbb-3(b)(1), unless the authorization is  terminated or revoked. Performed at [facility], [address] [place], [place] [address]   Surgical PCR screen     Status: Abnormal   Collection Time: 05/24/20  9:58 PM   Specimen: Nasal Mucosa; Nasal Swab  Result Value Ref Range Status   MRSA, PCR NEGATIVE NEGATIVE Final   Staphylococcus aureus POSITIVE (A) NEGATIVE Final    Comment: (NOTE) The Xpert SA Assay (FDA approved for NASAL specimens in patients 64 years of age and older), is one component of a comprehensive surveillance program. It is not intended to diagnose infection nor to guide or monitor treatment. Performed at [facility], [address] [place], [place] [address]      Radiology Studies: DG Lumbar Spine 2-3 Views  Result Date: 05/25/2020 CLINICAL DATA:  L2-L4 PLIF revision EXAM: LUMBAR SPINE - 2-3 VIEW; DG C-ARM 1-60 MIN COMPARISON:  05/23/2020 FLUOROSCOPY TIME:  1 minutes 1 second Dose: 72.68 mGy Images: 3 FINDINGS: Five lumbar vertebra by prior exam. Prior posterior fusion L4-S1. Pedicle screws now identified at L2 and L3 in addition to L4-S1. New disc prostheses at L2-L3 and L3-L4. Bones demineralized. No fracture or subluxation. IMPRESSION: Postsurgical changes of posterior L2-L4 fusion. Prior L4-S1 fusion. Electronically Signed   By: Ulyses SouthwardMark  Boles M.D.   On: 05/25/2020 14:35   DG C-Arm 1-60 Min  Result Date: 05/25/2020 CLINICAL DATA:  L2-L4 PLIF revision EXAM: LUMBAR SPINE - 2-3 VIEW; DG C-ARM 1-60 MIN COMPARISON:  05/23/2020 FLUOROSCOPY TIME:  1  minutes 1 second Dose: 72.68 mGy Images: 3 FINDINGS: Five lumbar vertebra by prior exam. Prior posterior fusion L4-S1. Pedicle screws now identified at L2 and L3 in addition to L4-S1. New disc prostheses at L2-L3 and L3-L4. Bones demineralized. No fracture or subluxation. IMPRESSION: Postsurgical changes of posterior L2-L4 fusion. Prior L4-S1 fusion. Electronically Signed   By: Ulyses SouthwardMark  Boles M.D.   On: 05/25/2020 14:35   DG Lumbar Spine  2-3 Views  Final Result    DG C-Arm 1-60 Min  Final Result    CT LUMBAR SPINE W CONTRAST  Final Result    DG Lumbar Spine Complete  Final Result      Scheduled Meds: . Chlorhexidine Gluconate Cloth  6 each Topical Q0600  . docusate sodium  100 mg Oral BID  . enoxaparin (LOVENOX) injection  40 mg Subcutaneous Q24H  . feeding supplement  237 mL Oral BID BM  . losartan  50 mg Oral Daily   And  . hydrochlorothiazide  12.5 mg Oral Daily  . mupirocin ointment  1 application Nasal BID  . pantoprazole  40 mg Oral QHS  . sodium chloride flush  3 mL Intravenous Q12H  . vitamin B-12  1,000 mcg Oral Daily   PRN Meds: acetaminophen **OR** acetaminophen, alum & mag hydroxide-simeth, bisacodyl, fluticasone, HYDROcodone-acetaminophen, HYDROmorphone, menthol-cetylpyridinium **OR** phenol, methocarbamol **OR** methocarbamol (ROBAXIN) IV, ondansetron **OR** ondansetron (ZOFRAN) IV, oxyCODONE, polyethylene glycol, sodium chloride flush, sodium phosphate Continuous Infusions: . sodium chloride    . dextrose 5 % and 0.45 % NaCl with KCl 20 mEq/L 75 mL/hr at 05/26/20 0825  . methocarbamol (ROBAXIN) IV       LOS: 2 days  Time spent: Greater than 50% of the 35 minute visit was spent in counseling/coordination of care for the patient as laid out in the A&P.   Lewie Chamber, MD [facility] 05/26/2020, 2:37 PM

## 2020-05-26 NOTE — Progress Notes (Signed)
Rehab Admissions Coordinator Note:  Per PT and OT recommendation, this patient was screened by Cheri Rous for appropriateness for an Inpatient Acute Rehab Consult.  At this time, we are recommending Inpatient Rehab consult. AC will place consult order per protocol.   Cheri Rous 05/26/2020, 3:25 PM  I can be reached at [phone].

## 2020-05-26 NOTE — Progress Notes (Addendum)
Subjective: Patient reports "I can move my leg better, but it still hurts"  Objective: Vital signs in last 24 hours: Temp:  [97.4 F (36.3 C)-98.7 F (37.1 C)] 98.4 F (36.9 C) (11/11 0835) Pulse Rate:  [84-105] 85 (11/11 0835) Resp:  [11-18] 18 (11/11 0835) BP: (110-142)/(67-92) 118/75 (11/11 0835) SpO2:  [94 %-100 %] 97 % (11/11 0835)  Intake/Output from previous day: 11/10 0701 - 11/11 0700 In: 3980 [P.O.:220; I.V.:2800; Blood:410; IV Piggyback:550] Out: 2320 [Urine:545; Drains:775; Blood:1000] Intake/Output this shift: Total I/O In: 240 [P.O.:240] Out: -   Pt sitting in chair in no acute distress, though he rates his current pain at 7/10 and ("earlier it was 25/10"). Improved RLE strength, though he remains weak on the right. He c/o right thigh pain and numbness, improved from pre-op, but similar to chronic symptoms. Incision without erythema,swelling or drainage. Hemovac patent - will leave in place today.   Lab Results: Recent Labs    05/24/20 0353 05/24/20 0353 05/25/20 1345 05/26/20 0029  WBC 9.8  --   --  13.3*  HGB 14.8   < > 12.9* 11.8*  HCT 45.5   < > 38.0* 36.2*  PLT 195  --   --  191   < > = values in this interval not displayed.   BMET Recent Labs    05/24/20 0353 05/24/20 0353 05/25/20 1345 05/26/20 0029  NA 139   < > 138 138  K 3.6   < > 4.3 4.1  CL 100   < > 101 101  CO2 30  --   --  27  GLUCOSE 106*   < > 157* 131*  BUN 16   < > 24* 22  CREATININE 1.05   < > 1.00 1.29*  CALCIUM 9.1  --   --  8.2*   < > = values in this interval not displayed.    Studies/Results: DG Lumbar Spine 2-3 Views  Result Date: 05/25/2020 CLINICAL DATA:  L2-L4 PLIF revision EXAM: LUMBAR SPINE - 2-3 VIEW; DG C-ARM 1-60 MIN COMPARISON:  05/23/2020 FLUOROSCOPY TIME:  1 minutes 1 second Dose: 72.68 mGy Images: 3 FINDINGS: Five lumbar vertebra by prior exam. Prior posterior fusion L4-S1. Pedicle screws now identified at L2 and L3 in addition to L4-S1. New disc  prostheses at L2-L3 and L3-L4. Bones demineralized. No fracture or subluxation. IMPRESSION: Postsurgical changes of posterior L2-L4 fusion. Prior L4-S1 fusion. Electronically Signed   By: Ulyses Southward M.D.   On: 05/25/2020 14:35   DG C-Arm 1-60 Min  Result Date: 05/25/2020 CLINICAL DATA:  L2-L4 PLIF revision EXAM: LUMBAR SPINE - 2-3 VIEW; DG C-ARM 1-60 MIN COMPARISON:  05/23/2020 FLUOROSCOPY TIME:  1 minutes 1 second Dose: 72.68 mGy Images: 3 FINDINGS: Five lumbar vertebra by prior exam. Prior posterior fusion L4-S1. Pedicle screws now identified at L2 and L3 in addition to L4-S1. New disc prostheses at L2-L3 and L3-L4. Bones demineralized. No fracture or subluxation. IMPRESSION: Postsurgical changes of posterior L2-L4 fusion. Prior L4-S1 fusion. Electronically Signed   By: Ulyses Southward M.D.   On: 05/25/2020 14:35    Assessment/Plan: improving  LOS: 2 days  Continue PT/OT with hopes for CIR. Discusse with pt the need to stop IV Dilaudid and transition to po meds. Will work on pain mgt. Family reports long Hx pain meds, but pt reports only tylenol and Mobic recently.    Georgiann Cocker 05/26/2020, 12:22 PM   Patient is doing well after surgery.  Mobilize as tolerated.

## 2020-05-26 NOTE — Progress Notes (Addendum)
Occupational Therapy Evaluation PTA, pt independent with ADL and mobility until recent onset of RLE weakness/numbness. Pt states he has fallen multiple times at home due to his leg weakness. Pt feels his RLE weakness has improved "a little bit" since surgery. Able to complete a stand pivot transfer to [facility] with min A however unable to ambulate at this time. Requires Mod A for LB ADL tasks. Pt hopeful to have continued improvement of RLE strength with goal to DC home, however, pt may need rehab at CIR to facilitate safe DC home. Discussed with PA. Will follow acutely to maximize functional level of independence.    05/26/20 1200  OT Visit Information  Last OT Received On 05/26/20  Assistance Needed +2 (for gait training, chair follow)  History of Present Illness 64 y.o. male with medical history significant of severe sciatica with right leg pain status post multiple back surgery, and status post spinal stimulator, HTN, idiopathic neuropathy, presented with worsening of right leg pain and weakness. CT imaging was performed which showed spinal stenosis at the L2/3 and L3/4. Pt underwent PLIF L2-4 on 05/25/2020.  Precautions  Precautions Fall  Precaution Comments RLE numbness, buckling  Home Living  Family/patient expects to be discharged to: Private residence  Living Arrangements Spouse/significant other  Available Help at Discharge Family;Available 24 hours/day  Type of Home House  Home Access Stairs to enter  Entrance Stairs-Number of Steps 3  Entrance Stairs-Rails Right  Home Layout Two level;Able to live on main level with bedroom/bathroom  Bathroom Nurse, children's Yes  How Accessible Accessible via walker  Home Equipment Walker - 2 wheels;Tub bench;Cane - single point Has reachers  Prior Function  Level of Independence Independent  Communication  Communication No difficulties  Pain Assessment  Pain Assessment 0-10  Pain  Score 4  Pain Location back  Pain Descriptors / Indicators Sore  Pain Intervention(s) Limited activity within patient's tolerance (was a "23 at 3 oclock this morning")  Cognition  Arousal/Alertness Awake/alert  Behavior During Therapy WFL for tasks assessed/performed  Overall Cognitive Status Within Functional Limits for tasks assessed  Upper Extremity Assessment  Upper Extremity Assessment Overall WFL for tasks assessed  Lower Extremity Assessment  Lower Extremity Assessment Defer to PT evaluation  RLE Deficits / Details difficulty with hip flexion  Cervical / Trunk Assessment  Cervical / Trunk Assessment Other exceptions (hx of multiple back surgeries)  ADL  Overall ADL's  Needs assistance/impaired  Grooming Set up;Sitting  Upper Body Bathing Set up;Sitting  Lower Body Bathing Sit to/from stand;Moderate assistance  Upper Body Dressing  Minimal assistance  Lower Body Dressing Moderate assistance;Sit to/from Administrator, arts Details (indicate cue type and reason) Very heavy useof RW  Toileting- Clothing Manipulation and Hygiene Moderate assistance  Functional mobility during ADLs Rolling walker;Cueing for safety  General ADL Comments Unable to complete figure four positioing of BLE; will need use of AE  Vision- History  Baseline Vision/History Wears glasses  Bed Mobility  General bed mobility comments OOB in chair  Transfers  Overall transfer level Needs assistance  Equipment used Rolling walker (2 wheeled)  Transfers Sit to/from [employer]  Sit to Stand Mod assist  Stand pivot transfers Min assist  General transfer comment Poor control of descent to chair; pt "falls" into chairs at baseline. Educated pt on imprtance fo NOT falling into chair s/p back surgery  Balance  Overall balance assessment Needs assistance  Sitting-balance support No upper extremity supported;Feet supported  Sitting balance-Leahy  Scale Good  Sitting balance - Comments supervision  Standing balance support Bilateral upper extremity supported  Standing balance-Leahy Scale Poor  Standing balance comment reliant on BUE support of RW  OT - End of Session  Equipment Utilized During Treatment Gait belt;Rolling walker;Back brace  Activity Tolerance Patient tolerated treatment well  Patient left in chair;with call bell/phone within reach;with family/visitor present  Nurse Communication Mobility status  OT Assessment  OT Recommendation/Assessment Patient needs continued OT Services  OT Visit Diagnosis Unsteadiness on feet (R26.81);Other abnormalities of gait and mobility (R26.89);Repeated falls (R29.6);Muscle weakness (generalized) (M62.81);Pain  Pain - part of body  (back)  OT Problem List Decreased strength;Decreased range of motion;Decreased activity tolerance;Impaired balance (sitting and/or standing);Decreased safety awareness;Decreased knowledge of use of DME or AE;Decreased knowledge of precautions;Obesity;Pain  OT Plan  OT Frequency (ACUTE ONLY) Min 3X/week  OT Treatment/Interventions (ACUTE ONLY) Self-care/ADL training;Therapeutic exercise;Neuromuscular education;Energy conservation;DME and/or AE instruction;Therapeutic activities;Patient/family education;Balance training  AM-PAC OT "6 Clicks" Daily Activity Outcome Measure (Version 2)  Help from another person eating meals? 4  Help from another person taking care of personal grooming? 3  Help from another person toileting, which includes using toliet, bedpan, or urinal? 2  Help from another person bathing (including washing, rinsing, drying)? 2  Help from another person to put on and taking off regular upper body clothing? 3  Help from another person to put on and taking off regular lower body clothing? 2  6 Click Score 16  OT Recommendation  Recommendations for Other Services Rehab consult  Follow Up Recommendations CIR;Supervision - Intermittent (pending  progress)  OT Equipment None recommended by OT  Individuals Consulted  Consulted and Agree with Results and Recommendations Patient  Acute Rehab OT Goals  Patient Stated Goal to improve strength adn walk without falling  OT Goal Formulation With patient  Time For Goal Achievement 06/09/20  Potential to Achieve Goals Good  OT Time Calculation  OT Start Time (ACUTE ONLY) 1125  OT Stop Time (ACUTE ONLY) 1150  OT Time Calculation (min) 25 min  OT General Charges  $OT Visit 1 Visit  OT Evaluation  $OT Eval Moderate Complexity 1 Mod  OT Treatments  $Self Care/Home Management  8-22 mins  Written Expression  Dominant Hand Right  Luisa Dago, OT/L   Acute OT Clinical Specialist Acute Rehabilitation Services Pager [phone] Office [phone]

## 2020-05-26 NOTE — Evaluation (Signed)
Physical Therapy Evaluation Patient Details Name: Thomas Savage MRN: [id number] DOB: May 10, 1956 Today's Date: 05/26/2020   History of Present Illness  64 y.o. male with medical history significant of severe sciatica with right leg pain status post multiple back surgery, and status post spinal stimulator, HTN, idiopathic neuropathy, presented with worsening of right leg pain and weakness. CT imaging was performed which showed spinal stenosis at the L2/3 and L3/4. Pt underwent PLIF L2-4 on 05/25/2020.  Clinical Impression  Pt presents to PT with deficits in functional mobility, gait, balance, sensation, proprioception. Pt with RLE weakness and numbness, resulting in buckling with pre-gait assessment in standing at the edge of bed. Due to R knee buckling and high falls risk further gait training was deferred. Pt will benefit from continued to acute PT POC to improve LE strength, gait, and balance in hopes of returning to independence. PT recommends CIR placement at this time as the pt remains a high falls risk and will benefit from high intensity inpatient PT services to aide in a return to his prior level of function.    Follow Up Recommendations CIR    Equipment Recommendations  Wheelchair (measurements PT) (if home today)    Recommendations for Other Services Rehab consult     Precautions / Restrictions Precautions Precautions: Fall Precaution Comments: RLE numbness, buckling Restrictions Weight Bearing Restrictions: No      Mobility  Bed Mobility Overal bed mobility: Needs Assistance Bed Mobility: Rolling;Sidelying to Sit Rolling: Supervision Sidelying to sit: Min guard       General bed mobility comments: cues for log roll technique    Transfers Overall transfer level: Needs assistance Equipment used: Rolling walker (2 wheeled) Transfers: Sit to/from [employer] Sit to Stand: Min assist;From elevated surface Stand pivot transfers: Min assist        General transfer comment: pt with Reamer shuffling steps to pivot to recliner, R knee hyperextends during stance phase to reduce risk of buckling  Ambulation/Gait Ambulation/Gait assistance: Min assist Gait Distance (Feet): 0 Feet Assistive device: Rolling walker (2 wheeled) Gait Pattern/deviations: Step-to pattern Gait velocity: 0   General Gait Details: pt takes 3 marching steps at edge of bed with R knee buckling, deferred further gait training  Stairs            Wheelchair Mobility    Modified Rankin (Stroke Patients Only)       Balance Overall balance assessment: Needs assistance Sitting-balance support: No upper extremity supported;Feet supported Sitting balance-Leahy Scale: Good Sitting balance - Comments: supervision   Standing balance support: Bilateral upper extremity supported Standing balance-Leahy Scale: Poor Standing balance comment: reliant on BUE support of RW                             Pertinent Vitals/Pain Pain Assessment: 0-10 Pain Score: 7  Pain Location: back Pain Descriptors / Indicators: Sore Pain Intervention(s): Monitored during session    Home Living Family/patient expects to be discharged to:: Private residence Living Arrangements: Spouse/significant other Available Help at Discharge: Family;Available 24 hours/day Type of Home: House Home Access: Stairs to enter Entrance Stairs-Rails: Right Entrance Stairs-Number of Steps: 3 Home Layout: Two level;Able to live on main level with bedroom/bathroom Home Equipment: Dan Humphreys - 2 wheels;Tub bench;Cane - single point      Prior Function Level of Independence: Independent               Hand Dominance  Extremity/Trunk Assessment   Upper Extremity Assessment Upper Extremity Assessment: Overall WFL for tasks assessed    Lower Extremity Assessment Lower Extremity Assessment: RLE deficits/detail RLE Deficits / Details: 4-/5 knee extension, 4/5 DF/PF RLE  Sensation: decreased light touch;decreased proprioception    Cervical / Trunk Assessment Cervical / Trunk Assessment: Normal  Communication   Communication: No difficulties  Cognition Arousal/Alertness: Awake/alert Behavior During Therapy: WFL for tasks assessed/performed Overall Cognitive Status: Within Functional Limits for tasks assessed                                        General Comments General comments (skin integrity, edema, etc.): VSS on RA    Exercises     Assessment/Plan    PT Assessment Patient needs continued PT services  PT Problem List Decreased strength;Decreased activity tolerance;Decreased balance;Decreased mobility;Impaired sensation;Pain       PT Treatment Interventions DME instruction;Gait training;Stair training;Functional mobility training;Therapeutic activities;Therapeutic exercise;Balance training;Neuromuscular re-education;Patient/family education    PT Goals (Current goals can be found in the Care Plan section)  Acute Rehab PT Goals Patient Stated Goal: To improve RLE strength and return to independence PT Goal Formulation: With patient Time For Goal Achievement: 06/09/20 Potential to Achieve Goals: Good    Frequency Min 5X/week   Barriers to discharge        Co-evaluation               AM-PAC PT "6 Clicks" Mobility  Outcome Measure Help needed turning from your back to your side while in a flat bed without using bedrails?: None Help needed moving from lying on your back to sitting on the side of a flat bed without using bedrails?: A Little Help needed moving to and from a bed to a chair (including a wheelchair)?: A Little Help needed standing up from a chair using your arms (e.g., wheelchair or bedside chair)?: A Little Help needed to walk in hospital room?: A Lot Help needed climbing 3-5 steps with a railing? : Total 6 Click Score: 16    End of Session Equipment Utilized During Treatment: Gait belt Activity  Tolerance: Patient tolerated treatment well Patient left: in chair;with call bell/phone within reach Nurse Communication: Mobility status PT Visit Diagnosis: Unsteadiness on feet (R26.81);Other abnormalities of gait and mobility (R26.89);Muscle weakness (generalized) (M62.81);Other symptoms and signs involving the nervous system (R29.898)    Time: 0102-7253 PT Time Calculation (min) (ACUTE ONLY): 24 min   Charges:   PT Evaluation $PT Eval Moderate Complexity: 1 Mod          Arlyss Gandy, PT, DPT Acute Rehabilitation Pager: [phone]   Arlyss Gandy 05/26/2020, 12:10 PM

## 2020-05-27 DIAGNOSIS — M5416 Radiculopathy, lumbar region: Secondary | ICD-10-CM | POA: Diagnosis not present

## 2020-05-27 LAB — BASIC METABOLIC PANEL
Anion gap: 8 (ref 5–15)
BUN: 24 mg/dL — ABNORMAL HIGH (ref 8–23)
CO2: 25 mmol/L (ref 22–32)
Calcium: 7.8 mg/dL — ABNORMAL LOW (ref 8.9–10.3)
Chloride: 103 mmol/L (ref 98–111)
Creatinine, Ser: 1.19 mg/dL (ref 0.61–1.24)
GFR, Estimated: >60 mL/min (ref >60)
Glucose, Bld: 124 mg/dL — ABNORMAL HIGH (ref 70–99)
Potassium: 3.7 mmol/L (ref 3.5–5.1)
Sodium: 136 mmol/L (ref 135–145)

## 2020-05-27 LAB — CBC WITH DIFFERENTIAL/PLATELET
Abs Immature Granulocytes: 0.04 10*3/uL (ref 0.00–0.07)
Basophils Absolute: 0.1 10*3/uL (ref 0.0–0.1)
Basophils Relative: 1 %
Eosinophils Absolute: 0.1 10*3/uL (ref 0.0–0.5)
Eosinophils Relative: 1 %
HCT: 29.1 % — ABNORMAL LOW (ref 39.0–52.0)
Hemoglobin: 9.4 g/dL — ABNORMAL LOW (ref 13.0–17.0)
Immature Granulocytes: 0 %
Lymphocytes Relative: 33 %
Lymphs Abs: 3.3 10*3/uL (ref 0.7–4.0)
MCH: 29.8 pg (ref 26.0–34.0)
MCHC: 32.3 g/dL (ref 30.0–36.0)
MCV: 92.4 fL (ref 80.0–100.0)
Monocytes Absolute: 0.9 10*3/uL (ref 0.1–1.0)
Monocytes Relative: 9 %
Neutro Abs: 5.6 10*3/uL (ref 1.7–7.7)
Neutrophils Relative %: 56 %
Platelets: 148 10*3/uL — ABNORMAL LOW (ref 150–400)
RBC: 3.15 MIL/uL — ABNORMAL LOW (ref 4.22–5.81)
RDW: 13.8 % (ref 11.5–15.5)
WBC: 10 10*3/uL (ref 4.0–10.5)
nRBC: 0 % (ref 0.0–0.2)

## 2020-05-27 LAB — MAGNESIUM: Magnesium: 2.1 mg/dL (ref 1.7–2.4)

## 2020-05-27 MED FILL — Sodium Chloride IV Soln 0.9%: INTRAVENOUS | Qty: 1000 | Status: AC

## 2020-05-27 MED FILL — Heparin Sodium (Porcine) Inj 1000 Unit/ML: INTRAMUSCULAR | Qty: 30 | Status: AC

## 2020-05-27 NOTE — Progress Notes (Addendum)
Inpatient Rehab Admissions:  Inpatient Rehab Consult received.  I met with patient and his wife, Izora Gala, at the bedside for rehabilitation assessment and to discuss goals and expectations of an inpatient rehab admission.  We briefly discussed CIR including 3 hrs of therapy each day, need to be followed by an MD each day, and need for prior authorization from insurance.  We did not discuss goals/estimated length of stay at this time.  Patient was able to ambulate 150' min guard with therapy this morning, but per documentation, was heavily reliant on UEs.  Per patient, he felt like his right leg would not accept his weight.  Pt also shares that this afternoon he has been experiencing shooting/burning pain radiating from his back down and around the front of his right leg.  This is accompanied by inability to flex his hip/knee in a heel slide, or flex hip in a straight leg raise.  Will let Verdis Prime, NP, and Dr. Vertell Limber know.    I will not have a bed available for this patient over the weekend, and potentially into next week.  I will f/u with him on Monday if he remains in house.   Signed: Shann Medal, PT, DPT Admissions Coordinator [phone] 05/27/20  4:11 PM

## 2020-05-27 NOTE — Progress Notes (Signed)
PROGRESS NOTE    Thomas Savage   HEN:[id number]  DOB: 1956-07-05  DOA: 05/23/2020     3  PCP: Patient, No Pcp Per  CC: RLE pain, numbness  Hospital Course: Thomas Savage is a 64 y.o. male with PMH severe sciatica with right leg pain s/p multiple back surgeries, and s/p spinal stimulatory. Also hx of HTN, idiopathic neuropathy who presented with worsening of right leg pain, weakness, and some numbness.   Baseline ambulation status was able to ambulate despite chronic back pain and shooting pain to right leg, but last Friday, patient started to experience breakthrough pains, shooting leg from back to side of right thigh and right calf.  He fell several times because of the right leg pain and weakness.  Denies any trouble urinate or bowel movement no fever chills.  No loss of consciousness.   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  Failed trial of Lyrica and gabapentin for worsening of balance problems. CT lumbar spine showed no acute findings but advanced degeneration noted in L2-3 and L3-4.  He was evaluated by neurosurgery and was recommended for decompression and fusion of L2-3 and L3-4 with exploration of his prior fusion and hardware. He underwent posterior lumbar interbody fusion to L2-3 and L3-4 on 05/25/2020. Previous hardware screws were also removed and replaced during surgery.  He began working with PT after surgery with plans for CIR at discharge.    Interval History:  Pain better this am. Did not sleep the greatest but otherwise doing okay. Working with PT and is amenable to rehab at time of discharge. Awaiting CIR eval.   Old records reviewed in assessment of this patient  ROS: Constitutional: negative for chills and fevers, Respiratory: negative for  cough, Cardiovascular: negative for chest pain and Gastrointestinal: negative for abdominal pain  Assessment & Plan: * Lumbar radiculopathy - severe lumbar stenosis seen on CT with neuro deficits on admission of weakness/numbness notably in RLE - patient now s/p PLIF of L2-3 and L3-4 on 11/10; he notes improvement in pain and sensation already - continue post op recommendations as per NSG, appreciate assistance with patient - continue pain control  - PT/OT okay per NSG at this time; tentative plan for CIR if able   Benign essential HTN - continue losartan, hctz - patient asked for regular diet instead of cardiac diet   Antimicrobials: Ancef  DVT prophylaxis: Lovenox Code Status: Full Family Communication: None present this morning Disposition Plan: Status is: Inpatient  Remains inpatient appropriate because:Inpatient level of care appropriate due to severity of illness and awaiting CIR bed   Dispo: The patient is from: Home              Anticipated d/c is to: CIR              Anticipated d/c date is: when approved/avail              Patient currently is medically stable to d/c.   Objective: Blood pressure 110/66, pulse 83, temperature 98.4 F (36.9 C), temperature source Oral, resp. rate 20, height 6\' 2"  (1.88 m), weight 131.5 kg, SpO2 97 %.  Examination: General appearance: Pleasant adult man laying in bed in no distress Head: Normocephalic, without obvious abnormality, atraumatic Eyes: EOMI Lungs: clear to auscultation bilaterally  Heart: regular rate and rhythm and S1, S2 normal Abdomen: Obese, soft, nontender, bowel sounds hypoactive and distant Extremities: No obvious edema.  Sensation improved in right lower extremity almost equal with left lower extremity Skin: mobility and turgor normal Neurologic: Improving sensation in right lower extremity.  Strength with plantar flexion almost equal in both lower extremities  Consultants:   Neurosurgery  Procedures:    05/25/2020, PLIF  Data Reviewed: I have personally reviewed following labs and imaging studies Results for orders placed or performed during the hospital encounter of 05/23/20 (from the past 24 hour(s))  Basic metabolic panel     Status: Abnormal   Collection Time: 05/27/20  2:51 AM  Result Value Ref Range   Sodium 136 135 - 145 mmol/L   Potassium 3.7 3.5 - 5.1 mmol/L   Chloride 103 98 - 111 mmol/L   CO2 25 22 - 32 mmol/L   Glucose, Bld 124 (H) 70 - 99 mg/dL   BUN 24 (H) 8 - 23 mg/dL   Creatinine, Ser 8.29 0.61 - 1.24 mg/dL   Calcium 7.8 (L) 8.9 - 10.3 mg/dL   GFR, Estimated >56 >21 mL/min   Anion gap 8 5 - 15  CBC with Differential/Platelet     Status: Abnormal   Collection Time: 05/27/20  2:51 AM  Result Value Ref Range   WBC 10.0 4.0 - 10.5 K/uL   RBC 3.15 (L) 4.22 - 5.81 MIL/uL   Hemoglobin 9.4 (L) 13.0 - 17.0 g/dL   HCT 30.8 (L) 39 - 52 %   MCV 92.4 80.0 - 100.0 fL   MCH 29.8 26.0 - 34.0 pg   MCHC 32.3 30.0 - 36.0 g/dL   RDW 65.7 84.6 - 96.2 %   Platelets 148 (L) 150 - 400 K/uL   nRBC 0.0 0.0 - 0.2 %   Neutrophils Relative % 56 %   Neutro Abs 5.6 1.7 - 7.7 K/uL   Lymphocytes Relative 33 %   Lymphs Abs 3.3 0.7 - 4.0 K/uL   Monocytes Relative 9 %   Monocytes Absolute 0.9 0.1 - 1.0 K/uL   Eosinophils Relative 1 %   Eosinophils Absolute 0.1 0.0 - 0.5 K/uL   Basophils Relative 1 %   Basophils Absolute 0.1 0.0 - 0.1 K/uL   Immature Granulocytes 0 %   Abs Immature Granulocytes 0.04 0.00 - 0.07 K/uL  Magnesium     Status: None   Collection Time: 05/27/20  2:51 AM  Result Value Ref Range   Magnesium 2.1 1.7 - 2.4 mg/dL    Recent Results (from the past 240 hour(s))  Respiratory Panel by RT PCR (Flu A&B, Covid) - Nasopharyngeal Swab     Status: None   Collection Time: 05/24/20  3:53 AM   Specimen: Nasopharyngeal Swab  Result Value Ref Range Status   SARS Coronavirus 2 by RT PCR NEGATIVE NEGATIVE Final    Comment: (NOTE) SARS-CoV-2 target nucleic acids are NOT  DETECTED.  The SARS-CoV-2 RNA is generally detectable in upper respiratoy specimens during the acute phase of infection. The lowest concentration of SARS-CoV-2 viral copies this assay can detect is 131 copies/mL. A negative result does not preclude SARS-Cov-2 infection and should not be used as the sole basis for treatment or other patient management decisions. A negative result may occur with  improper specimen collection/handling, submission of specimen other than nasopharyngeal swab, presence of viral mutation(s) within the areas targeted by this assay, and inadequate number of viral copies (<131 copies/mL). A negative result must  be combined with clinical observations, patient history, and epidemiological information. The expected result is Negative.  Fact Sheet for Patients:  https://www.moore.com/  Fact Sheet for Healthcare Providers:  https://www.young.biz/  This test is no t yet approved or cleared by the [place] FDA and  has been authorized for detection and/or diagnosis of SARS-CoV-2 by FDA under an Emergency Use Authorization (EUA). This EUA will remain  in effect (meaning this test can be used) for the duration of the COVID-19 declaration under Section 564(b)(1) of the Act, 21 U.S.C. section 360bbb-3(b)(1), unless the authorization is terminated or revoked sooner.     Influenza A by PCR NEGATIVE NEGATIVE Final   Influenza B by PCR NEGATIVE NEGATIVE Final    Comment: (NOTE) The Xpert Xpress SARS-CoV-2/FLU/RSV assay is intended as an aid in  the diagnosis of influenza from Nasopharyngeal swab specimens and  should not be used as a sole basis for treatment. Nasal washings and  aspirates are unacceptable for Xpert Xpress SARS-CoV-2/FLU/RSV  testing.  Fact Sheet for Patients: https://www.moore.com/  Fact Sheet for Healthcare Providers: https://www.young.biz/  This test is not yet  approved or cleared by the [place] FDA and  has been authorized for detection and/or diagnosis of SARS-CoV-2 by  FDA under an Emergency Use Authorization (EUA). This EUA will remain  in effect (meaning this test can be used) for the duration of the  Covid-19 declaration under Section 564(b)(1) of the Act, 21  U.S.C. section 360bbb-3(b)(1), unless the authorization is  terminated or revoked. Performed at [facility], [address], [place], [place] [address]   Surgical PCR screen     Status: Abnormal   Collection Time: 05/24/20  9:58 PM   Specimen: Nasal Mucosa; Nasal Swab  Result Value Ref Range Status   MRSA, PCR NEGATIVE NEGATIVE Final   Staphylococcus aureus POSITIVE (A) NEGATIVE Final    Comment: (NOTE) The Xpert SA Assay (FDA approved for NASAL specimens in patients 7 years of age and older), is one component of a comprehensive surveillance program. It is not intended to diagnose infection nor to guide or monitor treatment. Performed at [facility], [address] [place], [place] [address]      Radiology Studies: DG Lumbar Spine 2-3 Views  Result Date: 05/25/2020 CLINICAL DATA:  L2-L4 PLIF revision EXAM: LUMBAR SPINE - 2-3 VIEW; DG C-ARM 1-60 MIN COMPARISON:  05/23/2020 FLUOROSCOPY TIME:  1 minutes 1 second Dose: 72.68 mGy Images: 3 FINDINGS: Five lumbar vertebra by prior exam. Prior posterior fusion L4-S1. Pedicle screws now identified at L2 and L3 in addition to L4-S1. New disc prostheses at L2-L3 and L3-L4. Bones demineralized. No fracture or subluxation. IMPRESSION: Postsurgical changes of posterior L2-L4 fusion. Prior L4-S1 fusion. Electronically Signed   By: Ulyses Southward M.D.   On: 05/25/2020 14:35   DG C-Arm 1-60 Min  Result Date: 05/25/2020 CLINICAL DATA:  L2-L4 PLIF revision EXAM: LUMBAR SPINE - 2-3 VIEW; DG C-ARM 1-60 MIN COMPARISON:  05/23/2020 FLUOROSCOPY TIME:  1 minutes 1 second Dose: 72.68 mGy Images: 3 FINDINGS: Five lumbar vertebra by  prior exam. Prior posterior fusion L4-S1. Pedicle screws now identified at L2 and L3 in addition to L4-S1. New disc prostheses at L2-L3 and L3-L4. Bones demineralized. No fracture or subluxation. IMPRESSION: Postsurgical changes of posterior L2-L4 fusion. Prior L4-S1 fusion. Electronically Signed   By: Ulyses Southward M.D.   On: 05/25/2020 14:35   DG Lumbar Spine 2-3 Views  Final Result    DG C-Arm  1-60 Min  Final Result    CT LUMBAR SPINE W CONTRAST  Final Result    DG Lumbar Spine Complete  Final Result      Scheduled Meds: . Chlorhexidine Gluconate Cloth  6 each Topical Q0600  . docusate sodium  100 mg Oral BID  . enoxaparin (LOVENOX) injection  40 mg Subcutaneous Q24H  . feeding supplement  237 mL Oral BID BM  . losartan  50 mg Oral Daily   And  . hydrochlorothiazide  12.5 mg Oral Daily  . mupirocin ointment  1 application Nasal BID  . pantoprazole  40 mg Oral QHS  . sodium chloride flush  3 mL Intravenous Q12H  . vitamin B-12  1,000 mcg Oral Daily   PRN Meds: acetaminophen **OR** acetaminophen, alum & mag hydroxide-simeth, bisacodyl, fluticasone, HYDROcodone-acetaminophen, HYDROmorphone, menthol-cetylpyridinium **OR** phenol, methocarbamol **OR** methocarbamol (ROBAXIN) IV, ondansetron **OR** ondansetron (ZOFRAN) IV, polyethylene glycol, sodium chloride flush, sodium phosphate Continuous Infusions: . sodium chloride    . dextrose 5 % and 0.45 % NaCl with KCl 20 mEq/L 75 mL/hr at 05/26/20 2043  . methocarbamol (ROBAXIN) IV       LOS: 3 days  Time spent: Greater than 50% of the 35 minute visit was spent in counseling/coordination of care for the patient as laid out in the A&P.   Lewie Chamberavid Yani Lal, MD [facility] 05/27/2020, 12:55 PM

## 2020-05-27 NOTE — Progress Notes (Addendum)
Subjective: Patient reports "I'm still hoping for a good night's sleep, but I'm doing better"  Objective: Vital signs in last 24 hours: Temp:  [97.7 F (36.5 C)-99.5 F (37.5 C)] 99.5 F (37.5 C) (11/12 0423) Pulse Rate:  [74-98] 81 (11/12 0423) Resp:  [15-18] 15 (11/12 0423) BP: (100-118)/(60-75) 104/61 (11/12 0423) SpO2:  [95 %-100 %] 96 % (11/12 0423)  Intake/Output from previous day: 11/11 0701 - 11/12 0700 In: 2649.4 [P.O.:240; I.V.:2409.4] Out: 3105 [Urine:2330; Drains:775] Intake/Output this shift: No intake/output data recorded.  Alert, conversant. Moves himself about in bed without evidence of pain. Some right hip flexor weakness persists, but improved. Full strength LLE. Incision flat without erythema, swelling or drainage. Hemovac ~145ml overnight - will leave in place today. Pain well controlled on Dilaudid p.o.   Lab Results: Recent Labs    05/26/20 0029 05/27/20 0251  WBC 13.3* 10.0  HGB 11.8* 9.4*  HCT 36.2* 29.1*  PLT 191 148*   BMET Recent Labs    05/26/20 0029 05/27/20 0251  NA 138 136  K 4.1 3.7  CL 101 103  CO2 27 25  GLUCOSE 131* 124*  BUN 22 24*  CREATININE 1.29* 1.19  CALCIUM 8.2* 7.8*    Studies/Results: DG Lumbar Spine 2-3 Views  Result Date: 05/25/2020 CLINICAL DATA:  L2-L4 PLIF revision EXAM: LUMBAR SPINE - 2-3 VIEW; DG C-ARM 1-60 MIN COMPARISON:  05/23/2020 FLUOROSCOPY TIME:  1 minutes 1 second Dose: 72.68 mGy Images: 3 FINDINGS: Five lumbar vertebra by prior exam. Prior posterior fusion L4-S1. Pedicle screws now identified at L2 and L3 in addition to L4-S1. New disc prostheses at L2-L3 and L3-L4. Bones demineralized. No fracture or subluxation. IMPRESSION: Postsurgical changes of posterior L2-L4 fusion. Prior L4-S1 fusion. Electronically Signed   By: Ulyses Southward M.D.   On: 05/25/2020 14:35   DG C-Arm 1-60 Min  Result Date: 05/25/2020 CLINICAL DATA:  L2-L4 PLIF revision EXAM: LUMBAR SPINE - 2-3 VIEW; DG C-ARM 1-60 MIN COMPARISON:   05/23/2020 FLUOROSCOPY TIME:  1 minutes 1 second Dose: 72.68 mGy Images: 3 FINDINGS: Five lumbar vertebra by prior exam. Prior posterior fusion L4-S1. Pedicle screws now identified at L2 and L3 in addition to L4-S1. New disc prostheses at L2-L3 and L3-L4. Bones demineralized. No fracture or subluxation. IMPRESSION: Postsurgical changes of posterior L2-L4 fusion. Prior L4-S1 fusion. Electronically Signed   By: Ulyses Southward M.D.   On: 05/25/2020 14:35    Assessment/Plan: improving  LOS: 3 days  Continue to mobilize with PT/OT. CIR consult.   Georgiann Cocker 05/27/2020, 7:39 AM   Patient is improving.  He will benefit from Rehab admission.

## 2020-05-27 NOTE — Plan of Care (Signed)
  Problem: Activity: Goal: Ability to perform activities at highest level will improve Outcome: Progressing Goal: Muscle strength will improve Outcome: Progressing   Problem: Bowel/Gastric: Goal: Ability to demonstrate the techniques of an individualized bowel program will improve Outcome: Progressing   Problem: Education: Goal: Knowledge of disease or condition will improve Outcome: Progressing Goal: Knowledge of the prescribed therapeutic regimen will improve Outcome: Progressing   Problem: Coping: Goal: Ability to identify and develop effective coping behavior will improve Outcome: Progressing Goal: Ability to verbalize feelings will improve Outcome: Progressing   Problem: Self-Care: Goal: Ability to participate in self-care as condition permits will improve Outcome: Progressing   Problem: Skin Integrity: Goal: Risk for impaired skin integrity will decrease Outcome: Progressing   Problem: Urinary Elimination: Goal: Ability to achieve a regular elimination pattern will improve Outcome: Progressing   

## 2020-05-27 NOTE — Progress Notes (Signed)
Physical Therapy Treatment Patient Details Name: Thomas Savage MRN: [id number] DOB: 1955/10/20 Today's Date: 05/27/2020    History of Present Illness 64 y.o. male with medical history significant of severe sciatica with right leg pain status post multiple back surgery, and status post spinal stimulator, HTN, idiopathic neuropathy, presented with worsening of right leg pain and weakness. CT imaging was performed which showed spinal stenosis at the L2/3 and L3/4. Pt underwent PLIF L2-4 on 05/25/2020.    PT Comments    Pt tolerates treatment well with some improvement in R LE strength. Pt is able to progress to gait training with heavy reliance on UE support. Pt does continue to demonstrate R knee instability with fatigue when ambulation, and due to impaired sensation in the extremity the pt remains at a high falls risk. Pt will continue to benefit from acute PT POC to reduce falls risk and aide in a return to independence. PT continues to recommend CIR placement at this time as the pt is at a high falls risk and demonstrates the potential to return to independent mobility.  Follow Up Recommendations  CIR     Equipment Recommendations  None recommended by PT    Recommendations for Other Services       Precautions / Restrictions Precautions Precautions: Fall Precaution Comments: RLE numbness, buckling Restrictions Weight Bearing Restrictions: No    Mobility  Bed Mobility                  Transfers Overall transfer level: Needs assistance Equipment used: Rolling walker (2 wheeled) Transfers: Sit to/from Stand Sit to Stand: Min guard            Ambulation/Gait Ambulation/Gait assistance: Min guard Gait Distance (Feet): 150 Feet Assistive device: Rolling walker (2 wheeled) Gait Pattern/deviations: Step-to pattern Gait velocity: reduced Gait velocity interpretation: <1.8 ft/sec, indicate of risk for recurrent falls General Gait Details: pt demonstrates steady step  to gait, mild R knee buckling vs hyperextension with fatigue, heavy reliance on UE support throughout ambulation   Stairs             Wheelchair Mobility    Modified Rankin (Stroke Patients Only)       Balance Overall balance assessment: Needs assistance Sitting-balance support: No upper extremity supported;Feet supported Sitting balance-Leahy Scale: Good     Standing balance support: Bilateral upper extremity supported Standing balance-Leahy Scale: Poor Standing balance comment: reliant on BUE support of RW                            Cognition Arousal/Alertness: Awake/alert Behavior During Therapy: WFL for tasks assessed/performed Overall Cognitive Status: Within Functional Limits for tasks assessed                                        Exercises      General Comments General comments (skin integrity, edema, etc.): VSS on RA      Pertinent Vitals/Pain Pain Assessment: 0-10 Pain Score: 7  Pain Location: back Pain Descriptors / Indicators: Sore Pain Intervention(s): Premedicated before session    Home Living                      Prior Function            PT Goals (current goals can now be found in the care plan section)  Acute Rehab PT Goals Patient Stated Goal: to improve strength and walk without falling Progress towards PT goals: Progressing toward goals    Frequency    Min 5X/week      PT Plan Current plan remains appropriate    Co-evaluation              AM-PAC PT "6 Clicks" Mobility   Outcome Measure  Help needed turning from your back to your side while in a flat bed without using bedrails?: None Help needed moving from lying on your back to sitting on the side of a flat bed without using bedrails?: A Little Help needed moving to and from a bed to a chair (including a wheelchair)?: A Little Help needed standing up from a chair using your arms (e.g., wheelchair or bedside chair)?: A  Little Help needed to walk in hospital room?: A Little Help needed climbing 3-5 steps with a railing? : A Lot 6 Click Score: 18    End of Session Equipment Utilized During Treatment: Gait belt Activity Tolerance: Patient tolerated treatment well Patient left: in chair;with call bell/phone within reach;with chair alarm set;with family/visitor present Nurse Communication: Mobility status PT Visit Diagnosis: Unsteadiness on feet (R26.81);Other abnormalities of gait and mobility (R26.89);Muscle weakness (generalized) (M62.81);Other symptoms and signs involving the nervous system (R29.898)     Time: 2263-3354 PT Time Calculation (min) (ACUTE ONLY): 29 min  Charges:  $Gait Training: 8-22 mins $Therapeutic Activity: 8-22 mins                     Arlyss Gandy, PT, DPT Acute Rehabilitation Pager: [phone]    Arlyss Gandy 05/27/2020, 11:58 AM

## 2020-05-28 DIAGNOSIS — K5900 Constipation, unspecified: Secondary | ICD-10-CM

## 2020-05-28 DIAGNOSIS — M5416 Radiculopathy, lumbar region: Secondary | ICD-10-CM | POA: Diagnosis not present

## 2020-05-28 LAB — CBC WITH DIFFERENTIAL/PLATELET
Abs Immature Granulocytes: 0.08 10*3/uL — ABNORMAL HIGH (ref 0.00–0.07)
Basophils Absolute: 0.1 10*3/uL (ref 0.0–0.1)
Basophils Relative: 1 %
Eosinophils Absolute: 0.2 10*3/uL (ref 0.0–0.5)
Eosinophils Relative: 3 %
HCT: 28.4 % — ABNORMAL LOW (ref 39.0–52.0)
Hemoglobin: 9.2 g/dL — ABNORMAL LOW (ref 13.0–17.0)
Immature Granulocytes: 1 %
Lymphocytes Relative: 33 %
Lymphs Abs: 2.9 10*3/uL (ref 0.7–4.0)
MCH: 30 pg (ref 26.0–34.0)
MCHC: 32.4 g/dL (ref 30.0–36.0)
MCV: 92.5 fL (ref 80.0–100.0)
Monocytes Absolute: 0.7 10*3/uL (ref 0.1–1.0)
Monocytes Relative: 8 %
Neutro Abs: 4.7 10*3/uL (ref 1.7–7.7)
Neutrophils Relative %: 54 %
Platelets: 157 10*3/uL (ref 150–400)
RBC: 3.07 MIL/uL — ABNORMAL LOW (ref 4.22–5.81)
RDW: 13.5 % (ref 11.5–15.5)
WBC: 8.8 10*3/uL (ref 4.0–10.5)
nRBC: 0 % (ref 0.0–0.2)

## 2020-05-28 LAB — BASIC METABOLIC PANEL
Anion gap: 6 (ref 5–15)
BUN: 22 mg/dL (ref 8–23)
CO2: 28 mmol/L (ref 22–32)
Calcium: 7.8 mg/dL — ABNORMAL LOW (ref 8.9–10.3)
Chloride: 101 mmol/L (ref 98–111)
Creatinine, Ser: 1.06 mg/dL (ref 0.61–1.24)
GFR, Estimated: >60 mL/min (ref >60)
Glucose, Bld: 109 mg/dL — ABNORMAL HIGH (ref 70–99)
Potassium: 3.1 mmol/L — ABNORMAL LOW (ref 3.5–5.1)
Sodium: 135 mmol/L (ref 135–145)

## 2020-05-28 LAB — MAGNESIUM: Magnesium: 2.4 mg/dL (ref 1.7–2.4)

## 2020-05-28 MED ORDER — MAGNESIUM CITRATE PO SOLN: 1.0000 | Freq: Every day | ORAL | Status: DC | PRN

## 2020-05-28 MED ORDER — SENNOSIDES-DOCUSATE SODIUM 8.6-50 MG PO TABS
1.0000 | ORAL_TABLET | Freq: Two times a day (BID) | ORAL | Status: DC
  Administered 2020-05-28 – 2020-05-30 (×5): 1 via ORAL
  Filled 2020-05-28 (×5): qty 1

## 2020-05-28 MED ORDER — POTASSIUM CHLORIDE CRYS ER 20 MEQ PO TBCR
40.0000 meq | EXTENDED_RELEASE_TABLET | Freq: Once | ORAL | Status: AC
  Administered 2020-05-28: 40 meq via ORAL
  Filled 2020-05-28: qty 2

## 2020-05-28 MED ORDER — POLYETHYLENE GLYCOL 3350 17 G PO PACK
17.0000 g | PACK | Freq: Every day | ORAL | Status: DC
  Administered 2020-05-28: 17 g via ORAL
  Filled 2020-05-28 (×2): qty 1

## 2020-05-28 NOTE — Progress Notes (Signed)
Physical Therapy Treatment Patient Details Name: Thomas Savage MRN: [id number] DOB: 05-26-1956 Today's Date: 05/28/2020    History of Present Illness 64 y.o. male with medical history significant of severe sciatica with right leg pain status post multiple back surgery, and status post spinal stimulator, HTN, idiopathic neuropathy, presented with worsening of right leg pain and weakness. CT imaging was performed which showed spinal stenosis at the L2/3 and L3/4. Pt underwent PLIF L2-4 on 05/25/2020.    PT Comments    Pt supine in bed on arrival.  He wants to have a BM and hopeful for laxative to start working.  Pt required increased assistance today as pain in R LE is greater.  Based on function he remains an excellent candidate for aggressive rehab in a post acute setting to maximize functional gains before returning home.     Follow Up Recommendations  CIR     Equipment Recommendations  None recommended by PT    Recommendations for Other Services Rehab consult     Precautions / Restrictions Precautions Precautions: Fall Precaution Comments: RLE numbness, buckling Required Braces or Orthoses: Spinal Brace Spinal Brace: Lumbar corset;Applied in sitting position Restrictions Weight Bearing Restrictions: No    Mobility  Bed Mobility Overal bed mobility: Needs Assistance Bed Mobility: Rolling;Sidelying to Sit Rolling: Min assist Sidelying to sit: Min guard       General bed mobility comments: Min assistance to manage RLE into hooklying, Min guard to rise into sitting.  Transfers Overall transfer level: Needs assistance Equipment used: Rolling walker (2 wheeled) Transfers: Sit to/from Stand Sit to Stand: From elevated surface;Min guard Stand pivot transfers: Min guard       General transfer comment: Cues for hand placement to and from seated surface.  Poor eccentric load back to seated surface.  Ambulation/Gait Ambulation/Gait assistance: Min guard;Min  assist Gait Distance (Feet): 80 Feet Assistive device: Rolling walker (2 wheeled) Gait Pattern/deviations: Step-to pattern;Decreased stance time - right Gait velocity: reduced   General Gait Details: Cues for sequencing to keep weak R LE in RW in stance phase.  x2 standing breaks due to UE fatigue.   Stairs             Wheelchair Mobility    Modified Rankin (Stroke Patients Only)       Balance Overall balance assessment: Needs assistance Sitting-balance support: No upper extremity supported;Feet supported Sitting balance-Leahy Scale: Good Sitting balance - Comments: supervision     Standing balance-Leahy Scale: Poor Standing balance comment: reliant on BUE support of RW                            Cognition Arousal/Alertness: Awake/alert Behavior During Therapy: WFL for tasks assessed/performed Overall Cognitive Status: Within Functional Limits for tasks assessed                                        Exercises      General Comments        Pertinent Vitals/Pain Pain Assessment: 0-10 Pain Score: 7  Pain Location: R hip/knee Pain Descriptors / Indicators: Radiating Pain Intervention(s): Monitored during session;Repositioned;Ice applied    Home Living                      Prior Function            PT Goals (current  goals can now be found in the care plan section) Acute Rehab PT Goals Patient Stated Goal: to improve strength and walk without falling Potential to Achieve Goals: Good Progress towards PT goals: Progressing toward goals    Frequency    Min 5X/week      PT Plan Current plan remains appropriate    Co-evaluation              AM-PAC PT "6 Clicks" Mobility   Outcome Measure  Help needed turning from your back to your side while in a flat bed without using bedrails?: None Help needed moving from lying on your back to sitting on the side of a flat bed without using bedrails?: A Little Help  needed moving to and from a bed to a chair (including a wheelchair)?: A Little Help needed standing up from a chair using your arms (e.g., wheelchair or bedside chair)?: A Little Help needed to walk in hospital room?: A Little Help needed climbing 3-5 steps with a railing? : A Lot 6 Click Score: 18    End of Session Equipment Utilized During Treatment: Gait belt Activity Tolerance: Patient tolerated treatment well Patient left: in chair;with call bell/phone within reach;with family/visitor present Nurse Communication: Mobility status PT Visit Diagnosis: Unsteadiness on feet (R26.81);Other abnormalities of gait and mobility (R26.89);Muscle weakness (generalized) (M62.81);Other symptoms and signs involving the nervous system (R29.898)     Time: 7616-0737 PT Time Calculation (min) (ACUTE ONLY): 24 min  Charges:  $Gait Training: 8-22 mins $Therapeutic Activity: 8-22 mins                     Bonney Leitz , PTA Acute Rehabilitation Services Pager [phone] Office [phone]     Thomas Savage 05/28/2020, 1:35 PM

## 2020-05-28 NOTE — Progress Notes (Signed)
PROGRESS NOTE    Thomas Savage   ZOX:[id number]  DOB: 1956-01-20  DOA: 05/23/2020     4  PCP: Patient, No Pcp Per  CC: RLE pain, numbness  Hospital Course: Thomas Savage is a 64 y.o. male with PMH severe sciatica with right leg pain s/p multiple back surgeries, and s/p spinal stimulatory. Also hx of HTN, idiopathic neuropathy who presented with worsening of right leg pain, weakness, and some numbness.   Baseline ambulation status was able to ambulate despite chronic back pain and shooting pain to right leg, but last Friday, patient started to experience breakthrough pains, shooting leg from back to side of right thigh and right calf.  He fell several times because of the right leg pain and weakness.  Denies any trouble urinate or bowel movement no fever chills.  No loss of consciousness.   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  Failed trial of Lyrica and gabapentin for worsening of balance problems. CT lumbar spine showed no acute findings but advanced degeneration noted in L2-3 and L3-4.  He was evaluated by neurosurgery and was recommended for decompression and fusion of L2-3 and L3-4 with exploration of his prior fusion and hardware. He underwent posterior lumbar interbody fusion to L2-3 and L3-4 on 05/25/2020. Previous hardware screws were also removed and replaced during surgery.  He began working with PT after surgery with plans for CIR at discharge.   Interval History:  No events overnight.  Endorsing ongoing constipation for about 3 days now.  He is wanting something to try and help relieve this.  Endorses still some numbness and pain in his right lower extremity; thinks he might have overexerted himself walking yesterday.  Drain from back also removed this  morning with neurosurgery. He is amenable for rehab/CIR at discharge.  Not comfortable with going home at this time and does agree that he would benefit from rehab.  [facility] was another option he was thinking if CIR is unavailable.   Old records reviewed in assessment of this patient  ROS: Constitutional: negative for chills and fevers, Respiratory: negative for cough, Cardiovascular: negative for chest pain and Gastrointestinal: negative for abdominal pain  Assessment & Plan: * Lumbar radiculopathy - severe lumbar stenosis seen on CT with neuro deficits on admission of weakness/numbness notably in RLE - patient now s/p PLIF of L2-3 and L3-4 on 11/10; he notes improvement in pain and sensation already - continue post op recommendations as per NSG, appreciate assistance with patient - continue pain control  - PT/OT okay per NSG at this time; tentative plan for CIR or other rehab when bed avail. Patient also endorses wanting rehab and doesn't feel comfortable going home   Constipation - no BM in about 3 days now; opioids contributing  - will order scheduled miralax and senna-S - PRN mag citrate ordered as well  Benign essential HTN - continue losartan, hctz - patient asked for regular diet instead of cardiac diet   Antimicrobials: Ancef  DVT prophylaxis: Lovenox Code Status: Full Family Communication: Daughter bedside Disposition Plan: Status is: Inpatient  Remains inpatient appropriate because:Inpatient level of care appropriate due to severity of illness and awaiting CIR bed   Dispo: The patient is from: Home  Anticipated d/c is to: CIR              Anticipated d/c date is: when approved/avail              Patient currently is medically stable to d/c.   Objective: Blood pressure 129/66, pulse 78, temperature 98.2 F (36.8 C), temperature source Oral, resp. rate 16, height 6\' 2"  (1.88 m), weight 131.5 kg, SpO2 97 %.  Examination: General appearance:  Pleasant adult man laying in bed in no distress Head: Normocephalic, without obvious abnormality, atraumatic Eyes: EOMI Lungs: clear to auscultation bilaterally Heart: regular rate and rhythm and S1, S2 normal Abdomen: Obese, soft, nontender, bowel sounds hypoactive and distant Extremities: No obvious edema.  Sensation improved in right lower extremity almost equal with left lower extremity Skin: mobility and turgor normal Neurologic: Improving sensation in right lower extremity.  Strength with plantar flexion almost equal in both lower extremities  Consultants:   Neurosurgery  Procedures:   05/25/2020, PLIF  Data Reviewed: I have personally reviewed following labs and imaging studies Results for orders placed or performed during the hospital encounter of 05/23/20 (from the past 24 hour(s))  Basic metabolic panel     Status: Abnormal   Collection Time: 05/28/20  2:26 AM  Result Value Ref Range   Sodium 135 135 - 145 mmol/L   Potassium 3.1 (L) 3.5 - 5.1 mmol/L   Chloride 101 98 - 111 mmol/L   CO2 28 22 - 32 mmol/L   Glucose, Bld 109 (H) 70 - 99 mg/dL   BUN 22 8 - 23 mg/dL   Creatinine, Ser 05/30/20 0.61 - 1.24 mg/dL   Calcium 7.8 (L) 8.9 - 10.3 mg/dL   GFR, Estimated 3.14 >97 mL/min   Anion gap 6 5 - 15  CBC with Differential/Platelet     Status: Abnormal   Collection Time: 05/28/20  2:26 AM  Result Value Ref Range   WBC 8.8 4.0 - 10.5 K/uL   RBC 3.07 (L) 4.22 - 5.81 MIL/uL   Hemoglobin 9.2 (L) 13.0 - 17.0 g/dL   HCT 05/30/20 (L) 39 - 52 %   MCV 92.5 80.0 - 100.0 fL   MCH 30.0 26.0 - 34.0 pg   MCHC 32.4 30.0 - 36.0 g/dL   RDW 63.7 85.8 - 85.0 %   Platelets 157 150 - 400 K/uL   nRBC 0.0 0.0 - 0.2 %   Neutrophils Relative % 54 %   Neutro Abs 4.7 1.7 - 7.7 K/uL   Lymphocytes Relative 33 %   Lymphs Abs 2.9 0.7 - 4.0 K/uL   Monocytes Relative 8 %   Monocytes Absolute 0.7 0.1 - 1.0 K/uL   Eosinophils Relative 3 %   Eosinophils Absolute 0.2 0.0 - 0.5 K/uL   Basophils Relative 1 %    Basophils Absolute 0.1 0.0 - 0.1 K/uL   Immature Granulocytes 1 %   Abs Immature Granulocytes 0.08 (H) 0.00 - 0.07 K/uL  Magnesium     Status: None   Collection Time: 05/28/20  2:26 AM  Result Value Ref Range   Magnesium 2.4 1.7 - 2.4 mg/dL    Recent Results (from the past 240 hour(s))  Respiratory Panel by RT PCR (Flu A&B, Covid) - Nasopharyngeal Swab     Status: None   Collection Time: 05/24/20  3:53 AM   Specimen: Nasopharyngeal Swab  Result Value Ref Range Status   SARS Coronavirus 2 by RT PCR NEGATIVE NEGATIVE Final    Comment: (NOTE) SARS-CoV-2 target  nucleic acids are NOT DETECTED.  The SARS-CoV-2 RNA is generally detectable in upper respiratoy specimens during the acute phase of infection. The lowest concentration of SARS-CoV-2 viral copies this assay can detect is 131 copies/mL. A negative result does not preclude SARS-Cov-2 infection and should not be used as the sole basis for treatment or other patient management decisions. A negative result may occur with  improper specimen collection/handling, submission of specimen other than nasopharyngeal swab, presence of viral mutation(s) within the areas targeted by this assay, and inadequate number of viral copies (<131 copies/mL). A negative result must be combined with clinical observations, patient history, and epidemiological information. The expected result is Negative.  Fact Sheet for Patients:  https://www.moore.com/  Fact Sheet for Healthcare Providers:  https://www.young.biz/  This test is no t yet approved or cleared by the [place] FDA and  has been authorized for detection and/or diagnosis of SARS-CoV-2 by FDA under an Emergency Use Authorization (EUA). This EUA will remain  in effect (meaning this test can be used) for the duration of the COVID-19 declaration under Section 564(b)(1) of the Act, 21 U.S.C. section 360bbb-3(b)(1), unless the authorization is  terminated or revoked sooner.     Influenza A by PCR NEGATIVE NEGATIVE Final   Influenza B by PCR NEGATIVE NEGATIVE Final    Comment: (NOTE) The Xpert Xpress SARS-CoV-2/FLU/RSV assay is intended as an aid in  the diagnosis of influenza from Nasopharyngeal swab specimens and  should not be used as a sole basis for treatment. Nasal washings and  aspirates are unacceptable for Xpert Xpress SARS-CoV-2/FLU/RSV  testing.  Fact Sheet for Patients: https://www.moore.com/  Fact Sheet for Healthcare Providers: https://www.young.biz/  This test is not yet approved or cleared by the [place] FDA and  has been authorized for detection and/or diagnosis of SARS-CoV-2 by  FDA under an Emergency Use Authorization (EUA). This EUA will remain  in effect (meaning this test can be used) for the duration of the  Covid-19 declaration under Section 564(b)(1) of the Act, 21  U.S.C. section 360bbb-3(b)(1), unless the authorization is  terminated or revoked. Performed at [facility], [address], [place], [place] [address]   Surgical PCR screen     Status: Abnormal   Collection Time: 05/24/20  9:58 PM   Specimen: Nasal Mucosa; Nasal Swab  Result Value Ref Range Status   MRSA, PCR NEGATIVE NEGATIVE Final   Staphylococcus aureus POSITIVE (A) NEGATIVE Final    Comment: (NOTE) The Xpert SA Assay (FDA approved for NASAL specimens in patients 72 years of age and older), is one component of a comprehensive surveillance program. It is not intended to diagnose infection nor to guide or monitor treatment. Performed at [facility], [address], [place], [place] [address]      Radiology Studies: No results found. DG Lumbar Spine 2-3 Views  Final Result    DG C-Arm 1-60 Min  Final Result    CT LUMBAR SPINE W CONTRAST  Final Result    DG Lumbar Spine Complete  Final Result      Scheduled Meds: . Chlorhexidine Gluconate Cloth  6 each  Topical Q0600  . enoxaparin (LOVENOX) injection  40 mg Subcutaneous Q24H  . feeding supplement  237 mL Oral BID BM  . losartan  50 mg Oral Daily   And  . hydrochlorothiazide  12.5 mg Oral Daily  . mupirocin ointment  1 application Nasal BID  . pantoprazole  40 mg Oral QHS  .  polyethylene glycol  17 g Oral Daily  . senna-docusate  1 tablet Oral BID  . sodium chloride flush  3 mL Intravenous Q12H  . vitamin B-12  1,000 mcg Oral Daily   PRN Meds: acetaminophen **OR** acetaminophen, alum & mag hydroxide-simeth, bisacodyl, fluticasone, HYDROcodone-acetaminophen, HYDROmorphone, magnesium citrate, menthol-cetylpyridinium **OR** phenol, methocarbamol **OR** methocarbamol (ROBAXIN) IV, ondansetron **OR** ondansetron (ZOFRAN) IV, sodium chloride flush, sodium phosphate Continuous Infusions: . sodium chloride    . methocarbamol (ROBAXIN) IV       LOS: 4 days  Time spent: Greater than 50% of the 35 minute visit was spent in counseling/coordination of care for the patient as laid out in the A&P.   Lewie Chamber, MD [facility] 05/28/2020, 3:28 PM

## 2020-05-28 NOTE — Progress Notes (Signed)
Patient ID: Thomas Savage, male   DOB: [birth year], 64 y.o.   MRN: [id number] BP 128/75   Pulse 85   Temp 98.8 F (37.1 C) (Oral)   Resp 17   Ht 6\' 2"  (1.88 m)   Wt 131.5 kg   SpO2 100%   BMI 37.23 kg/m  Alert and oriented x 4, speech is clear and fluent Moving all extremities, weak in lower extremities, gait is slightly unsteady Wound is clean,and dry Removed drain this am Awaiting placement

## 2020-05-28 NOTE — Assessment & Plan Note (Addendum)
-   no BM in about 3 days now; opioids contributing  - will order scheduled miralax and senna-S - PRN mag citrate ordered as well - as of 11/14 he has had a BM and this is improving

## 2020-05-28 NOTE — Progress Notes (Signed)
Occupational Therapy Treatment Patient Details Name: Thomas Savage MRN: [id number] DOB: 02-Mar-1956 Today's Date: 05/28/2020    History of present illness 64 y.o. male with medical history significant of severe sciatica with right leg pain status post multiple back surgery, and status post spinal stimulator, HTN, idiopathic neuropathy, presented with worsening of right leg pain and weakness. CT imaging was performed which showed spinal stenosis at the L2/3 and L3/4. Pt underwent PLIF L2-4 on 05/25/2020.   OT comments  Pt. Seen for skilled OT treatment session.  Pt. Completed ub/lb b/d at sink with combination of seated and standing for different portions.  Pt. With good demo of back precautions during all adls and functional mobility.  Reports he has a reacher at home and will need to use it for LB dressing.  Pt. States he has spoken with his wife and they do not feel she could provide physical assistance for him.  States if he does not get into CIR they have been looking into other options for [name] term therapy prior to home.    Follow Up Recommendations  CIR;Supervision - Intermittent (see above)   Equipment Recommendations  None recommended by OT    Recommendations for Other Services Rehab consult    Precautions / Restrictions Precautions Precautions: Fall Precaution Comments: RLE numbness, buckling       Mobility Bed Mobility Overal bed mobility: Needs Assistance Bed Mobility: Rolling;Sidelying to Sit   Sidelying to sit: Supervision          Transfers Overall transfer level: Needs assistance Equipment used: Rolling walker (2 wheeled) Transfers: Sit to/from [employer] Sit to Stand: Min guard Stand pivot transfers: Min guard       General transfer comment: better control with sitting today.  reaching for arm rests and controled descend.  no knee buckling noted. pt. states it occurs when he is "give out".    Balance                                            ADL either performed or assessed with clinical judgement   ADL Overall ADL's : Needs assistance/impaired     Grooming: Wash/dry hands;Wash/dry face;Oral care;Min guard;Standing;Brushing hair   Upper Body Bathing: Set up;Sitting   Lower Body Bathing: Min guard;Sit to/from stand   Upper Body Dressing : Minimal assistance;Sitting   Lower Body Dressing: Moderate assistance;Sit to/from stand Lower Body Dressing Details (indicate cue type and reason): reports he has a reacher at home he will have wife bring for cont. practice with Toilet Transfer: Immunologist Details (indicate cue type and reason): simulated in room ambulation to chair at sink, did not have to use the b.room at time of session. also reports he uses urinal         Functional mobility during ADLs: Rolling walker;Cueing for safety General ADL Comments: completed ub/lb b/d seated and also standing for portions at sink. good pace, no safety cues required.  aware of need to pace tasks.  needs reacher, has one at home wife to bring in for him.     Vision       Perception     Praxis      Cognition Arousal/Alertness: Awake/alert Behavior During Therapy: WFL for tasks assessed/performed Overall Cognitive Status: Within Functional Limits for tasks assessed  Exercises     Shoulder Instructions       General Comments  pt. Retired from [employer] se[employer] but really enjoys wood working     Pertinent Vitals/ Pain       Pain Assessment: 0-10 Pain Score: 5  Pain Location: R leg/knee Pain Descriptors / Indicators: Aching Pain Intervention(s): Limited activity within patient's tolerance;Monitored during session;Ice applied  Home Living                                          Prior Functioning/Environment              Frequency  Min 3X/week        Progress Toward Goals  OT Goals(current  goals can now be found in the care plan section)  Progress towards OT goals: Progressing toward goals     Plan      Co-evaluation                 AM-PAC OT "6 Clicks" Daily Activity     Outcome Measure   Help from another person eating meals?: None Help from another person taking care of personal grooming?: A Little Help from another person toileting, which includes using toliet, bedpan, or urinal?: A Lot Help from another person bathing (including washing, rinsing, drying)?: A Lot Help from another person to put on and taking off regular upper body clothing?: A Little Help from another person to put on and taking off regular lower body clothing?: A Lot 6 Click Score: 16    End of Session Equipment Utilized During Treatment: Gait belt;Rolling walker;Back brace  OT Visit Diagnosis: Unsteadiness on feet (R26.81);Other abnormalities of gait and mobility (R26.89);Repeated falls (R29.6);Muscle weakness (generalized) (M62.81);Pain   Activity Tolerance Patient tolerated treatment well   Patient Left in chair;with call bell/phone within reach   Nurse Communication Other (comment) (spoke with RN that pt. will likely need larger bsc with elongated seat for use in b.room while here)        Time: 0910-0950 OT Time Calculation (min): 40 min  Charges: OT General Charges $OT Visit: 1 Visit OT Treatments $Self Care/Home Management : 38-52 mins  Thomas Savage, COTA/L Acute Rehabilitation [phone]   Thomas Savage 05/28/2020, 12:24 PM

## 2020-05-29 DIAGNOSIS — H1132 Conjunctival hemorrhage, left eye: Secondary | ICD-10-CM | POA: Diagnosis not present

## 2020-05-29 DIAGNOSIS — M5416 Radiculopathy, lumbar region: Secondary | ICD-10-CM | POA: Diagnosis not present

## 2020-05-29 MED ORDER — IBUPROFEN 200 MG PO TABS
400.0000 mg | ORAL_TABLET | Freq: Four times a day (QID) | ORAL | Status: DC | PRN
  Administered 2020-05-29 – 2020-05-30 (×3): 400 mg via ORAL
  Filled 2020-05-29 (×3): qty 2

## 2020-05-29 MED ORDER — ACETAMINOPHEN 500 MG PO TABS
1000.0000 mg | ORAL_TABLET | Freq: Four times a day (QID) | ORAL | Status: DC
  Administered 2020-05-29 – 2020-05-30 (×6): 1000 mg via ORAL
  Filled 2020-05-29 (×6): qty 2

## 2020-05-29 NOTE — Progress Notes (Signed)
PROGRESS NOTE    Thomas Savage   FAO:[id number]  DOB: 11-20-1955  DOA: 05/23/2020     5  PCP: Patient, No Pcp Per  CC: RLE pain, numbness  Hospital Course: Thomas Savage is a 64 y.o. male with PMH severe sciatica with right leg pain s/p multiple back surgeries, and s/p spinal stimulatory. Also hx of HTN, idiopathic neuropathy who presented with worsening of right leg pain, weakness, and some numbness.   Baseline ambulation status was able to ambulate despite chronic back pain and shooting pain to right leg, but last Friday, patient started to experience breakthrough pains, shooting leg from back to side of right thigh and right calf.  He fell several times because of the right leg pain and weakness.  Denies any trouble urinate or bowel movement no fever chills.  No loss of consciousness.   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  Failed trial of Lyrica and gabapentin for worsening of balance problems. CT lumbar spine showed no acute findings but advanced degeneration noted in L2-3 and L3-4.  He was evaluated by neurosurgery and was recommended for decompression and fusion of L2-3 and L3-4 with exploration of his prior fusion and hardware. He underwent posterior lumbar interbody fusion to L2-3 and L3-4 on 05/25/2020. Previous hardware screws were also removed and replaced during surgery.  He began working with PT after surgery with plans for CIR or other rehab at discharge.   Interval History:  No events overnight.  He did have a BM yesterday after the laxative/enema.  Left hand IV has been irritating him also. Left eye has also had some redness on cornea; reassured patient and daughter about subconjunctival hematoma and will resolve slowly .   Old records reviewed  in assessment of this patient  ROS: Constitutional: negative for chills and fevers, Respiratory: negative for cough, Cardiovascular: negative for chest pain and Gastrointestinal: negative for abdominal pain  Assessment & Plan: * Lumbar radiculopathy - severe lumbar stenosis seen on CT with neuro deficits on admission of weakness/numbness notably in RLE - patient now s/p PLIF of L2-3 and L3-4 on 11/10; he notes improvement in pain and sensation already - continue post op recommendations as per NSG, appreciate assistance with patient - continue pain control  - PT/OT okay per NSG at this time; tentative plan for CIR or other rehab when bed avail. Patient also endorses wanting rehab and doesn't feel comfortable going home  - pain regimen modified - okay to d/c IV and leave out  Subconjunctival hematoma, left - present since surgery; does not cross iris; no pain, photophobia, change in vision - continue monitoring and supportive care  Constipation - no BM in about 3 days now; opioids contributing  - will order scheduled miralax and senna-S - PRN mag citrate ordered as well - as of 11/14 he has had a BM and this is improving   Benign essential HTN - continue losartan, hctz - patient asked for regular diet instead of cardiac diet   Antimicrobials: Ancef  DVT prophylaxis: Lovenox Code Status: Full Family Communication: Daughter bedside Disposition Plan: Status is: Inpatient  Remains inpatient appropriate because:Inpatient level of care appropriate due to severity of illness and awaiting CIR bed or other rehab bed   Dispo: The patient is from: Home  Anticipated d/c is to: CIR or other rehab facility              Anticipated d/c date is: when approved/avail              Patient currently is medically stable to d/c.   Objective: Blood pressure 116/71, pulse 79, temperature 98.9 F (37.2 C), temperature source Oral, resp. rate 20, height 6\' 2"  (1.88 m), weight 131.5  kg, SpO2 98 %.  Examination: General appearance: Pleasant adult man sitting up in chair with daughter bedside; he is comfortable and no distress Head: Normocephalic, without obvious abnormality, atraumatic Eyes: EOMI Lungs: clear to auscultation bilaterally Heart: regular rate and rhythm and S1, S2 normal Abdomen: Obese, soft, nontender, bowel sounds normal now Extremities: No obvious edema.  Sensation improved in right lower extremity almost equal with left lower extremity Skin: mobility and turgor normal Neurologic: Improving sensation in right lower extremity.  Strength with plantar flexion almost equal in both lower extremities; flexion in right hip reduced   Consultants:   Neurosurgery  Procedures:   05/25/2020, PLIF  Data Reviewed: I have personally reviewed following labs and imaging studies No results found for this or any previous visit (from the past 24 hour(s)).  Recent Results (from the past 240 hour(s))  Respiratory Panel by RT PCR (Flu A&B, Covid) - Nasopharyngeal Swab     Status: None   Collection Time: 05/24/20  3:53 AM   Specimen: Nasopharyngeal Swab  Result Value Ref Range Status   SARS Coronavirus 2 by RT PCR NEGATIVE NEGATIVE Final    Comment: (NOTE) SARS-CoV-2 target nucleic acids are NOT DETECTED.  The SARS-CoV-2 RNA is generally detectable in upper respiratoy specimens during the acute phase of infection. The lowest concentration of SARS-CoV-2 viral copies this assay can detect is 131 copies/mL. A negative result does not preclude SARS-Cov-2 infection and should not be used as the sole basis for treatment or other patient management decisions. A negative result may occur with  improper specimen collection/handling, submission of specimen other than nasopharyngeal swab, presence of viral mutation(s) within the areas targeted by this assay, and inadequate number of viral copies (<131 copies/mL). A negative result must be combined with  clinical observations, patient history, and epidemiological information. The expected result is Negative.  Fact Sheet for Patients:  13/09/21  Fact Sheet for Healthcare Providers:  https://www.moore.com/  This test is no t yet approved or cleared by the https://www.young.biz/ FDA and  has been authorized for detection and/or diagnosis of SARS-CoV-2 by FDA under an Emergency Use Authorization (EUA). This EUA will remain  in effect (meaning this test can be used) for the duration of the COVID-19 declaration under Section 564(b)(1) of the Act, 21 U.S.C. section 360bbb-3(b)(1), unless the authorization is terminated or revoked sooner.     Influenza A by PCR NEGATIVE NEGATIVE Final   Influenza B by PCR NEGATIVE NEGATIVE Final    Comment: (NOTE) The Xpert Xpress SARS-CoV-2/FLU/RSV assay is intended as an aid in  the diagnosis of influenza from Nasopharyngeal swab specimens and  should not be used as a sole basis for treatment. Nasal washings and  aspirates are unacceptable for Xpert Xpress SARS-CoV-2/FLU/RSV  testing.  Fact Sheet for Patients: [place]  Fact Sheet for Healthcare Providers: https://www.moore.com/  This test is not yet approved or cleared by the https://www.young.biz/ FDA and  has been authorized for detection and/or diagnosis of SARS-CoV-2 by  FDA under an Emergency Use Authorization (EUA). This EUA will remain  in effect (meaning this test can be used) for the duration of the  Covid-19 declaration under Section 564(b)(1) of the Act, 21  U.S.C. section 360bbb-3(b)(1), unless the authorization is  terminated or revoked. Performed at [facility], [address], [place], [place] [address]   Surgical PCR screen     Status: Abnormal   Collection Time: 05/24/20  9:58 PM   Specimen: Nasal Mucosa; Nasal Swab  Result Value Ref Range Status   MRSA, PCR NEGATIVE NEGATIVE Final    Staphylococcus aureus POSITIVE (A) NEGATIVE Final    Comment: (NOTE) The Xpert SA Assay (FDA approved for NASAL specimens in patients 64 years of age and older), is one component of a comprehensive surveillance program. It is not intended to diagnose infection nor to guide or monitor treatment. Performed at [facility], [address] [place], [place] [address]      Radiology Studies: No results found. DG Lumbar Spine 2-3 Views  Final Result    DG C-Arm 1-60 Min  Final Result    CT LUMBAR SPINE W CONTRAST  Final Result    DG Lumbar Spine Complete  Final Result      Scheduled Meds: . acetaminophen  1,000 mg Oral Q6H  . Chlorhexidine Gluconate Cloth  6 each Topical Q0600  . enoxaparin (LOVENOX) injection  40 mg Subcutaneous Q24H  . feeding supplement  237 mL Oral BID BM  . losartan  50 mg Oral Daily   And  . hydrochlorothiazide  12.5 mg Oral Daily  . mupirocin ointment  1 application Nasal BID  . pantoprazole  40 mg Oral QHS  . polyethylene glycol  17 g Oral Daily  . senna-docusate  1 tablet Oral BID  . vitamin B-12  1,000 mcg Oral Daily   PRN Meds: alum & mag hydroxide-simeth, bisacodyl, fluticasone, HYDROmorphone, ibuprofen, magnesium citrate, menthol-cetylpyridinium **OR** phenol, methocarbamol **OR** [DISCONTINUED] methocarbamol (ROBAXIN) IV, ondansetron **OR** ondansetron (ZOFRAN) IV Continuous Infusions:    LOS: 5 days  Time spent: Greater than 50% of the 35 minute visit was spent in counseling/coordination of care for the patient as laid out in the A&P.   Lewie Chamber, MD [facility] 05/29/2020, 2:20 PM

## 2020-05-29 NOTE — Progress Notes (Signed)
Physical Therapy Treatment Patient Details Name: Thomas Savage MRN: [id number] DOB: 1956/04/21 Today's Date: 05/29/2020    History of Present Illness 64 y.o. male with medical history significant of severe sciatica with right leg pain status post multiple back surgery, and status post spinal stimulator, HTN, idiopathic neuropathy, presented with worsening of right leg pain and weakness. CT imaging was performed which showed spinal stenosis at the L2/3 and L3/4. Pt underwent PLIF L2-4 on 05/25/2020.    PT Comments    Pt required min guard assist bed mobility, min guard assist transfers, and min guard assist ambulation 100' with RW. Continues to present with pain and weakness RLE. Pt in recliner at end of session. Pt is interested in ST SN[facility] at [facility] [place] if no CIR bed available.    Follow Up Recommendations  CIR     Equipment Recommendations  None recommended by PT    Recommendations for Other Services       Precautions / Restrictions Precautions Precautions: Fall Precaution Comments: RLE numbness, buckling Required Braces or Orthoses: Spinal Brace Spinal Brace: Applied in sitting position;Lumbar corset Restrictions Weight Bearing Restrictions: No RLE Weight Bearing: Weight bearing as tolerated    Mobility  Bed Mobility Overal bed mobility: Needs Assistance Bed Mobility: Rolling;Sidelying to Sit Rolling: Modified independent (Device/Increase time) Sidelying to sit: Min guard;HOB elevated       General bed mobility comments: +rail, increased time and effort  Transfers Overall transfer level: Needs assistance Equipment used: Rolling walker (2 wheeled) Transfers: Sit to/from Stand Sit to Stand: From elevated surface;Min guard         General transfer comment: increased time to attain full, upright stance. Poor eccentric load back to seated surface  Ambulation/Gait Ambulation/Gait assistance: Min guard Gait Distance (Feet): 100 Feet Assistive  device: Rolling walker (2 wheeled) Gait Pattern/deviations: Step-through pattern;Decreased stride length;Decreased weight shift to right;Decreased stance time - right Gait velocity: decreased Gait velocity interpretation: <1.31 ft/sec, indicative of household ambulator General Gait Details: cues for posture   Stairs             Wheelchair Mobility    Modified Rankin (Stroke Patients Only)       Balance Overall balance assessment: Needs assistance Sitting-balance support: No upper extremity supported;Feet supported Sitting balance-Leahy Scale: Good     Standing balance support: Bilateral upper extremity supported;During functional activity Standing balance-Leahy Scale: Poor Standing balance comment: reliant on BUE support of RW                            Cognition Arousal/Alertness: Awake/alert Behavior During Therapy: WFL for tasks assessed/performed Overall Cognitive Status: Within Functional Limits for tasks assessed                                        Exercises      General Comments General comments (skin integrity, edema, etc.): VSS on RA      Pertinent Vitals/Pain Pain Assessment: 0-10 Pain Score: 6  Pain Location: RLE Pain Descriptors / Indicators: Radiating;Discomfort Pain Intervention(s): Monitored during session;Repositioned;Ice applied    Home Living                      Prior Function            PT Goals (current goals can now be found in the care  plan section) Acute Rehab PT Goals Patient Stated Goal: get to rehab Progress towards PT goals: Progressing toward goals    Frequency    Min 5X/week      PT Plan Current plan remains appropriate    Co-evaluation              AM-PAC PT "6 Clicks" Mobility   Outcome Measure  Help needed turning from your back to your side while in a flat bed without using bedrails?: None Help needed moving from lying on your back to sitting on the side of a  flat bed without using bedrails?: A Little Help needed moving to and from a bed to a chair (including a wheelchair)?: A Little Help needed standing up from a chair using your arms (e.g., wheelchair or bedside chair)?: A Little Help needed to walk in hospital room?: A Little Help needed climbing 3-5 steps with a railing? : A Lot 6 Click Score: 18    End of Session Equipment Utilized During Treatment: Gait belt;Back brace Activity Tolerance: Patient tolerated treatment well Patient left: in chair;with call bell/phone within reach Nurse Communication: Mobility status PT Visit Diagnosis: Unsteadiness on feet (R26.81);Other abnormalities of gait and mobility (R26.89);Muscle weakness (generalized) (M62.81);Other symptoms and signs involving the nervous system (Z61.096)     Time: 0454-0981 PT Time Calculation (min) (ACUTE ONLY): 24 min  Charges:  $Gait Training: 23-37 mins                     Thomas Savage, [place]  Office # [phone] Pager [phone]    Ilda Foil 05/29/2020, 11:40 AM

## 2020-05-29 NOTE — Progress Notes (Signed)
Patient ID: Thomas Savage, male   DOB: [birth year], 64 y.o.   MRN: [id number] BP 129/72 (BP Location: Right Arm)   Pulse 82   Temp 98.5 F (36.9 C) (Oral)   Resp 18   Ht 6\' 2"  (1.88 m)   Wt 131.5 kg   SpO2 95%   BMI 37.23 kg/m  Alert and oriented x4, speech is clear and fluent Moving extremities well Wound is clean, dry, no signs of infection Continue with physical therapy

## 2020-05-29 NOTE — Assessment & Plan Note (Signed)
-   present since surgery; does not cross iris; no pain, photophobia, change in vision - continue monitoring and supportive care

## 2020-05-30 DIAGNOSIS — M5416 Radiculopathy, lumbar region: Secondary | ICD-10-CM | POA: Diagnosis not present

## 2020-05-30 MED ORDER — IBUPROFEN 400 MG PO TABS: 400.0000 mg | ORAL_TABLET | Freq: Four times a day (QID) | ORAL | 0 refills | Status: DC | PRN

## 2020-05-30 NOTE — Progress Notes (Signed)
PROGRESS NOTE    Thomas Savage   PJA:[id number]  DOB: December 31, 1955  DOA: 05/23/2020     6  PCP: Patient, No Pcp Per  CC: RLE pain, numbness  Hospital Course: Thomas Savage is a 64 y.o. male with PMH severe sciatica with right leg pain s/p multiple back surgeries, and s/p spinal stimulatory. Also hx of HTN, idiopathic neuropathy who presented with worsening of right leg pain, weakness, and some numbness.   Baseline ambulation status was able to ambulate despite chronic back pain and shooting pain to right leg, but last Friday, patient started to experience breakthrough pains, shooting leg from back to side of right thigh and right calf.  He fell several times because of the right leg pain and weakness.  Denies any trouble urinate or bowel movement no fever chills.  No loss of consciousness.   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  Failed trial of Lyrica and gabapentin for worsening of balance problems. CT lumbar spine showed no acute findings but advanced degeneration noted in L2-3 and L3-4.  He was evaluated by neurosurgery and was recommended for decompression and fusion of L2-3 and L3-4 with exploration of his prior fusion and hardware. He underwent posterior lumbar interbody fusion to L2-3 and L3-4 on 05/25/2020. Previous hardware screws were also removed and replaced during surgery.  He began working with PT after surgery with plans for CIR or other rehab at discharge.   Interval History:  No events overnight.  States the pain is better controlled with scheduled Tylenol and the as needed ibuprofen. Strength in his right leg is also improving. He is still hoping for rehab but is also becoming restless remaining in the hospital. If difficult to find a facility, he  was contemplating going home with outpatient physical therapy however is still worried about having a couple of stairs at home that he would have to go up and down twice a day.  Old records reviewed in assessment of this patient  ROS: Constitutional: negative for chills and fevers, Respiratory: negative for cough, Cardiovascular: negative for chest pain and Gastrointestinal: negative for abdominal pain  Assessment & Plan: * Lumbar radiculopathy - severe lumbar stenosis seen on CT with neuro deficits on admission of weakness/numbness notably in RLE - patient now s/p PLIF of L2-3 and L3-4 on 11/10; he notes improvement in pain and sensation already - continue post op recommendations as per NSG, appreciate assistance with patient - continue pain control  - PT/OT okay per NSG at this time; tentative plan for CIR or other rehab when bed avail. Patient also endorses wanting rehab and doesn't feel comfortable going home  - pain regimen modified - okay to d/c IV and leave out  Subconjunctival hematoma, left - present since surgery; does not cross iris; no pain, photophobia, change in vision - continue monitoring and supportive care  Benign essential HTN - continue losartan, hctz - patient asked for regular diet instead of cardiac diet  Constipation-resolved as of 05/30/2020 - no BM in about 3 days now; opioids contributing  - will order scheduled miralax and senna-S - PRN mag citrate ordered as well - as of 11/14 he has had a BM and this is improving    Antimicrobials: Ancef  DVT prophylaxis: Lovenox Code Status: Full Family Communication:  Daughter bedside Disposition Plan: Status is: Inpatient  Remains inpatient appropriate because:Inpatient level of care appropriate due to severity of illness and awaiting CIR bed or other rehab bed   Dispo: The patient is from: Home              Anticipated d/c is to: CIR or other rehab facility              Anticipated d/c date is: when  approved/avail              Patient currently is medically stable to d/c.   Objective: Blood pressure 111/70, pulse 71, temperature 98.1 F (36.7 C), temperature source Oral, resp. rate 20, height 6\' 2"  (1.88 m), weight 131.5 kg, SpO2 100 %.  Examination: General appearance: Pleasant adult man sitting up in chair Head: Normocephalic, without obvious abnormality, atraumatic Eyes: EOMI Lungs: clear to auscultation bilaterally Heart: regular rate and rhythm and S1, S2 normal Abdomen: Obese, soft, nontender, bowel sounds normal now Extremities: No obvious edema.  Sensation improved in right lower extremity almost equal with left lower extremity Skin: mobility and turgor normal Neurologic: Improving sensation in right lower extremity.  Strength with plantar flexion almost equal in both lower extremities  Consultants:   Neurosurgery  Procedures:   05/25/2020, PLIF  Data Reviewed: I have personally reviewed following labs and imaging studies No results found for this or any previous visit (from the past 24 hour(s)).  Recent Results (from the past 240 hour(s))  Respiratory Panel by RT PCR (Flu A&B, Covid) - Nasopharyngeal Swab     Status: None   Collection Time: 05/24/20  3:53 AM   Specimen: Nasopharyngeal Swab  Result Value Ref Range Status   SARS Coronavirus 2 by RT PCR NEGATIVE NEGATIVE Final    Comment: (NOTE) SARS-CoV-2 target nucleic acids are NOT DETECTED.  The SARS-CoV-2 RNA is generally detectable in upper respiratoy specimens during the acute phase of infection. The lowest concentration of SARS-CoV-2 viral copies this assay can detect is 131 copies/mL. A negative result does not preclude SARS-Cov-2 infection and should not be used as the sole basis for treatment or other patient management decisions. A negative result may occur with  improper specimen collection/handling, submission of specimen other than nasopharyngeal swab, presence of viral mutation(s) within the  areas targeted by this assay, and inadequate number of viral copies (<131 copies/mL). A negative result must be combined with clinical observations, patient history, and epidemiological information. The expected result is Negative.  Fact Sheet for Patients:  13/09/21  Fact Sheet for Healthcare Providers:  https://www.moore.com/  This test is no t yet approved or cleared by the https://www.young.biz/ FDA and  has been authorized for detection and/or diagnosis of SARS-CoV-2 by FDA under an Emergency Use Authorization (EUA). This EUA will remain  in effect (meaning this test can be used) for the duration of the COVID-19 declaration under Section 564(b)(1) of the Act, 21 U.S.C. section 360bbb-3(b)(1), unless the authorization is terminated or revoked sooner.     Influenza A by PCR NEGATIVE NEGATIVE Final   Influenza B by PCR NEGATIVE NEGATIVE Final    Comment: (NOTE) The Xpert Xpress SARS-CoV-2/FLU/RSV assay is intended as an aid in  the diagnosis of influenza from Nasopharyngeal swab specimens and  should not be used as a sole basis for treatment. Nasal washings and  aspirates are unacceptable for Xpert Xpress SARS-CoV-2/FLU/RSV  testing.  Fact Sheet for Patients: [place]  Fact Sheet for Healthcare Providers: https://www.moore.com/  This test is  not yet approved or cleared by the [place] and  has been authorized for detection and/or diagnosis of SARS-CoV-2 by  FDA under an Emergency Use Authorization (EUA). This EUA will remain  in effect (meaning this test can be used) for the duration of the  Covid-19 declaration under Section 564(b)(1) of the Act, 21  U.S.C. section 360bbb-3(b)(1), unless the authorization is  terminated or revoked. Performed at [facility], [address], [place], [place] [address]   Surgical PCR screen     Status: Abnormal   Collection  Time: 05/24/20  9:58 PM   Specimen: Nasal Mucosa; Nasal Swab  Result Value Ref Range Status   MRSA, PCR NEGATIVE NEGATIVE Final   Staphylococcus aureus POSITIVE (A) NEGATIVE Final    Comment: (NOTE) The Xpert SA Assay (FDA approved for NASAL specimens in patients 59 years of age and older), is one component of a comprehensive surveillance program. It is not intended to diagnose infection nor to guide or monitor treatment. Performed at [facility], [address] [place], [place] [address]      Radiology Studies: No results found. DG Lumbar Spine 2-3 Views  Final Result    DG C-Arm 1-60 Min  Final Result    CT LUMBAR SPINE W CONTRAST  Final Result    DG Lumbar Spine Complete  Final Result      Scheduled Meds: . acetaminophen  1,000 mg Oral Q6H  . enoxaparin (LOVENOX) injection  40 mg Subcutaneous Q24H  . feeding supplement  237 mL Oral BID BM  . losartan  50 mg Oral Daily   And  . hydrochlorothiazide  12.5 mg Oral Daily  . pantoprazole  40 mg Oral QHS  . polyethylene glycol  17 g Oral Daily  . senna-docusate  1 tablet Oral BID  . vitamin B-12  1,000 mcg Oral Daily   PRN Meds: alum & mag hydroxide-simeth, bisacodyl, fluticasone, HYDROmorphone, ibuprofen, magnesium citrate, menthol-cetylpyridinium **OR** phenol, methocarbamol **OR** [DISCONTINUED] methocarbamol (ROBAXIN) IV, ondansetron **OR** ondansetron (ZOFRAN) IV Continuous Infusions:    LOS: 6 days  Time spent: Greater than 50% of the 35 minute visit was spent in counseling/coordination of care for the patient as laid out in the A&P.   Lewie Chamber, MD [facility] 05/30/2020, 10:44 AM

## 2020-05-30 NOTE — Progress Notes (Addendum)
Occupational Therapy Treatment Patient Details Name: Thomas Savage MRN: [id number] DOB: May 12, 1956 Today's Date: 05/30/2020    History of present illness 64 y.o. male with medical history significant of severe sciatica with right leg pain status post multiple back surgery, and status post spinal stimulator, HTN, idiopathic neuropathy, presented with worsening of right leg pain and weakness. CT imaging was performed which showed spinal stenosis at the L2/3 and L3/4. Pt underwent PLIF L2-4 on 05/25/2020.   OT comments  Pt making steady progress towards OT goals this session. Pt seems to be most limited by RLE weakness however noted improvement in RLE strength and mobility in comparison to previous sessions. Pt able to complete household distance functional mobility with RW and min guard assist with no LOB or buckling of RLE. Pt completed IADL task with reacher where pt was instructed to gather wash cloths from floor level with reacher in order to facilitate independence with IADL task and to maintain back precautions. Reiterated back precautions during session however pt very knowledgeable about precautions and compensatory methods to maintain precautions. Noted family no longer requesting CIR, feel pt safe to return home with intermittent supervision from wife and no OT f/u. Will let OTR know about change in POC. Pt seemed most concerned with navigating stairs and per PT pt did well.  Will follow acutely and update DC plan as needed.    Follow Up Recommendations  No OT follow up;Supervision - Intermittent    Equipment Recommendations  None recommended by OT    Recommendations for Other Services      Precautions / Restrictions Precautions Precautions: Fall;Back Precaution Booklet Issued: No Precaution Comments: RLE numbness, buckling, able to adhere to precautions Required Braces or Orthoses: Spinal Brace Spinal Brace: Applied in sitting position;Lumbar corset Restrictions Weight Bearing  Restrictions: No RLE Weight Bearing: Weight bearing as tolerated       Mobility Bed Mobility Overal bed mobility: Needs Assistance Bed Mobility: Rolling;Sidelying to Sit Rolling: Modified independent (Device/Increase time) Sidelying to sit: Modified independent (Device/Increase time);HOB elevated       General bed mobility comments: HOB elevated minimally 20 degrees. Good demo of log roll technique.  Transfers Overall transfer level: Needs assistance Equipment used: Rolling walker (2 wheeled) Transfers: Sit to/from Stand Sit to Stand: Supervision         General transfer comment: Supervision for safety. Stood from [employer], from chair x1, transferred to recliner post session.    Balance Overall balance assessment: Needs assistance Sitting-balance support: Feet supported;No upper extremity supported Sitting balance-Leahy Scale: Good Sitting balance - Comments: supervision, able to donn brace without difficulty. Able to doff socks/donn shoes using equipment without assist.   Standing balance support: During functional activity Standing balance-Leahy Scale: Poor Standing balance comment: reliant on BUE support of RW during dynamic tasks.                           ADL either performed or assessed with clinical judgement   ADL Overall ADL's : Needs assistance/impaired       Grooming Details (indicate cue type and reason): pt able to verbalize appropriate compensatory methods to maintain back precautions for grooming tasks               Lower Body Dressing Details (indicate cue type and reason): pt able to demo figure four method Toilet Transfer: Min IT sales professional Details (indicate cue type and reason): simulated via functional mobility   Toileting -  Clothing Manipulation Details (indicate cue type and reason): discussed compensatory methods for posterior pericare, education provided on using bidet for cleanliness   Tub/Shower  Transfer Details (indicate cue type and reason): pt reports tub bench at home, education on using leg lifter to get BLEs into tub, pt able to simulate technique from chair Functional mobility during ADLs: Min guard;Rolling walker General ADL Comments: pt limited mostly by RLE weakness, pt reports feeling like he must have hurt his knee when he fell on stairs at home, however no buckling or LOB noted during functional mobility. pt complete household distance functional mobility with RW and min guard asssit for safety pt even able to complete IADL task with reacher with min guard assist. good carryover of back precautions noted     Vision       Perception     Praxis      Cognition Arousal/Alertness: Awake/alert Behavior During Therapy: WFL for tasks assessed/performed Overall Cognitive Status: Within Functional Limits for tasks assessed                                          Exercises     Shoulder Instructions       General Comments Wife present during session but stayed in room during stair training. Discussed in detail disposition options and what they each entail.    Pertinent Vitals/ Pain       Pain Assessment: Faces Faces Pain Scale: Hurts even more Pain Location: back Pain Descriptors / Indicators: Sore;Operative site guarding Pain Intervention(s): Monitored during session;Repositioned  Home Living                                          Prior Functioning/Environment              Frequency  Min 2X/week        Progress Toward Goals  OT Goals(current goals can now be found in the care plan section)  Progress towards OT goals: Progressing toward goals  Acute Rehab OT Goals Patient Stated Goal: get to rehab OT Goal Formulation: With patient Time For Goal Achievement: 06/09/20 Potential to Achieve Goals: Good  Plan Discharge plan needs to be updated;Frequency needs to be updated    Co-evaluation                  AM-PAC OT "6 Clicks" Daily Activity     Outcome Measure   Help from another person eating meals?: None Help from another person taking care of personal grooming?: None Help from another person toileting, which includes using toliet, bedpan, or urinal?: A Lot Help from another person bathing (including washing, rinsing, drying)?: A Little Help from another person to put on and taking off regular upper body clothing?: None Help from another person to put on and taking off regular lower body clothing?: A Little 6 Click Score: 20    End of Session Equipment Utilized During Treatment: Back brace;Rolling walker  OT Visit Diagnosis: Unsteadiness on feet (R26.81);Other abnormalities of gait and mobility (R26.89);Repeated falls (R29.6);Muscle weakness (generalized) (M62.81);Pain   Activity Tolerance Patient tolerated treatment well   Patient Left in chair;with call bell/phone within reach;with family/visitor present   Nurse Communication Mobility status        Time: 1020-1055 OT Time Calculation (min): 35  min  Charges: OT General Charges $OT Visit: 1 Visit OT Treatments $Self Care/Home Management : 23-37 mins  Thomas Amel., Thomas Savage Acute Rehabilitation Services [phone]    Thomas Savage 05/30/2020, 1:19 PM

## 2020-05-30 NOTE — Progress Notes (Signed)
Inpatient Rehab Admissions Coordinator:   Met with patient and his wife at bedside to discuss CIR again.  They are ready to move on to the next level of care, and I will not have a bed for this patient at least until Wednesday.  They are agreeable to pursuing either SNF or outpatient.  I let Bronson Curb, RN CM, know and I will sign off.   Shann Medal, PT, DPT Admissions Coordinator [phone] 05/30/20  11:38 AM

## 2020-05-30 NOTE — TOC Transition Note (Signed)
Transition of Care [facility]) - CM/SW Discharge Note   Patient Details  Name: FERDINAND REVOIR MRN: [id number] Date of Birth: Feb 27, 1956  Transition of Care [facility]) CM/SW Contact:  Pollie Friar, RN Phone Number: 05/30/2020, 4:02 PM   Clinical Narrative:    Recommendations changed to outpatient therapy. CM met with the patient and he would like to attend outpatient at [facility]. CM verified with Dr Alphonzo Dublin office that the outpatient therapy was indicated. Per Bonnita Nasuti at his office Dr Christella Noa agrees with outpatient therapy. Orders in epic and information on the AVS. No DME needs.  Pt has transportation home.    Final next level of care: OP Rehab Barriers to Discharge: No Barriers Identified   Patient Goals and CMS Choice     Choice offered to / list presented to : Patient  Discharge Placement                       Discharge Plan and Services                                     Social Determinants of Health (SDOH) Interventions     Readmission Risk Interventions No flowsheet data found.

## 2020-05-30 NOTE — NC FL2 (Signed)
[place] MEDICAID [id number] LEVEL OF CARE SCREENING TOOL     IDENTIFICATION  Patient Name: Thomas Savage Birthdate: 1955-12-20 Sex: male Admission Date (Current Location): 05/23/2020  [facility] and [place] Number:  Producer, television/film/video and Address:  [facility] [place], [address], [place], [place] [address]      Provider Number: [id number]  Attending Physician Name and Address:  Lewie Chamber, MD  Relative Name and Phone Number:       Current Level of Care: Hospital Recommended Level of Care: Skilled Nursing Facility Prior Approval Number:    Date Approved/Denied:   PASRR Number: [id number]  Discharge Plan: SNF    Current Diagnoses: Patient Active Problem List   Diagnosis Date Noted  . Subconjunctival hematoma, left 05/29/2020  . Lumbar radiculopathy 05/25/2020  . Benign essential HTN 05/25/2020  . Impaired ambulation 05/24/2020  . Right-sided low back pain with right-sided sciatica     Orientation RESPIRATION BLADDER Height & Weight     Self, Time, Situation, Place  Normal Continent Weight: 290 lb (131.5 kg) Height:  6\' 2"  (188 cm)  BEHAVIORAL SYMPTOMS/MOOD NEUROLOGICAL BOWEL NUTRITION STATUS      Continent Diet  AMBULATORY STATUS COMMUNICATION OF NEEDS Skin   Limited Assist Verbally Surgical wounds (closed back incision, honeycomb dressing change PRN)                       Personal Care Assistance Level of Assistance  Bathing, Feeding, Dressing Bathing Assistance: Limited assistance Feeding assistance: Independent Dressing Assistance: Limited assistance     Functional Limitations Info             SPECIAL CARE FACTORS FREQUENCY  PT (By licensed PT), OT (By licensed OT)     PT Frequency: 5x/wk OT Frequency: 5x/wk            Contractures Contractures Info: Not present    Additional Factors Info  Code Status, Allergies Code Status Info: Full Allergies Info: Morphine And Related           Current Medications  (05/30/2020):  This is the current hospital active medication list Current Facility-Administered Medications  Medication Dose Route Frequency Provider Last Rate Last Admin  . acetaminophen (TYLENOL) tablet 1,000 mg  1,000 mg Oral Q6H 06/01/2020, MD   1,000 mg at 05/30/20 0641  . alum & mag hydroxide-simeth (MAALOX/MYLANTA) 200-200-20 MG/5ML suspension 30 mL  30 mL Oral Q6H PRN 01-22-2001, MD      . bisacodyl (DULCOLAX) suppository 10 mg  10 mg Rectal Daily PRN Maeola Harman, MD      . enoxaparin (LOVENOX) injection 40 mg  40 mg Subcutaneous Q24H Maeola Harman, MD   40 mg at 05/29/20 1320  . feeding supplement (ENSURE SURGERY) liquid 237 mL  237 mL Oral BID BM 05/31/20, MD   237 mL at 05/30/20 1040  . fluticasone (FLONASE) 50 MCG/ACT nasal spray 1 spray  1 spray Each Nare Daily PRN 06/01/20, MD      . losartan (COZAAR) tablet 50 mg  50 mg Oral Daily Maeola Harman, MD   50 mg at 05/30/20 1040   And  . hydrochlorothiazide (MICROZIDE) capsule 12.5 mg  12.5 mg Oral Daily 06/01/20, MD   12.5 mg at 05/30/20 1040  . HYDROmorphone (DILAUDID) tablet 2 mg  2 mg Oral Q4H PRN 06/01/20, MD   2 mg at 05/28/20 0356  . ibuprofen (ADVIL) tablet 400 mg  400 mg  Oral Q6H PRN Lewie Chamber, MD   400 mg at 05/30/20 0644  . magnesium citrate solution 1 Bottle  1 Bottle Oral Daily PRN Lewie Chamber, MD      . menthol-cetylpyridinium (CEPACOL) lozenge 3 mg  1 lozenge Oral PRN Maeola Harman, MD       Or  . phenol [facility]) mouth spray 1 spray  1 spray Mouth/Throat PRN Maeola Harman, MD      . methocarbamol (ROBAXIN) tablet 750 mg  750 mg Oral Q6H PRN Maeola Harman, MD   750 mg at 05/30/20 0644  . ondansetron (ZOFRAN) tablet 4 mg  4 mg Oral Q6H PRN Maeola Harman, MD       Or  . ondansetron [facility]) injection 4 mg  4 mg Intravenous Q6H PRN Maeola Harman, MD      . pantoprazole (PROTONIX) EC tablet 40 mg  40 mg Oral Axel Filler, MD   40 mg at 05/29/20 2329  . polyethylene glycol  (MIRALAX / GLYCOLAX) packet 17 g  17 g Oral Daily Lewie Chamber, MD   17 g at 05/28/20 1134  . senna-docusate (Senokot-S) tablet 1 tablet  1 tablet Oral BID Lewie Chamber, MD   1 tablet at 05/30/20 1039  . vitamin B-12 (CYANOCOBALAMIN) tablet 1,000 mcg  1,000 mcg Oral Daily Maeola Harman, MD   1,000 mcg at 05/30/20 1039     Discharge Medications: Please see discharge summary for a list of discharge medications.  Relevant Imaging Results:  Relevant Lab Results:   Additional Information SS#: [id number]  Baldemar Lenis, LCSW

## 2020-05-30 NOTE — Progress Notes (Signed)
Physical Therapy Treatment Patient Details Name: Thomas Savage MRN: [id number] DOB: February 17, 1956 Today's Date: 05/30/2020    History of Present Illness 64 y.o. male with medical history significant of severe sciatica with right leg pain status post multiple back surgery, and status post spinal stimulator, HTN, idiopathic neuropathy, presented with worsening of right leg pain and weakness. CT imaging was performed which showed spinal stenosis at the L2/3 and L3/4. Pt underwent PLIF L2-4 on 05/25/2020.    PT Comments    Patient progressing well towards PT goals. Reports his pain is improved and his RLE strength is getting better slowly. Continues to have right knee instability especially when fatigued. Tolerated stair training multiple times today to prepare pt for d/c home. Performed with 2 handrails (which pt does not have at home) as well as 1 handrail going up the stairs sideways. Family is going to have another rail installed on stairs to enter home for safety. Pt functioning at a Min guard-supervision level for mobility with use of RW. After discussion with pt, discharge recommendation updated to home with OPPT with recommendation to install additional handrail prior to going home. Pt agreeable and on board with plan. Will follow.   Follow Up Recommendations  Outpatient PT     Equipment Recommendations  None recommended by PT    Recommendations for Other Services       Precautions / Restrictions Precautions Precautions: Fall;Back Precaution Booklet Issued: No Precaution Comments: RLE numbness, buckling, able to adhere to precautions Required Braces or Orthoses: Spinal Brace Spinal Brace: Applied in sitting position;Lumbar corset Restrictions Weight Bearing Restrictions: No RLE Weight Bearing: Weight bearing as tolerated    Mobility  Bed Mobility Overal bed mobility: Needs Assistance Bed Mobility: Rolling;Sidelying to Sit Rolling: Modified independent (Device/Increase  time) Sidelying to sit: Modified independent (Device/Increase time);HOB elevated       General bed mobility comments: HOB elevated minimally 20 degrees. Good demo of log roll technique.  Transfers Overall transfer level: Needs assistance Equipment used: Rolling walker (2 wheeled) Transfers: Sit to/from Stand Sit to Stand: Supervision         General transfer comment: Supervision for safety. Stood from [employer], from chair x1, transferred to recliner post session.  Ambulation/Gait Ambulation/Gait assistance: Min guard Gait Distance (Feet): 100 Feet (x2 bouts) Assistive device: Rolling walker (2 wheeled) Gait Pattern/deviations: Step-through pattern;Decreased stride length;Decreased weight shift to right;Decreased stance time - right;Trunk flexed Gait velocity: decreased Gait velocity interpretation: <1.31 ft/sec, indicative of household ambulator General Gait Details: Slow, mildly unsteady gait wtih flexed posture; right knee hyperextension thrust noted during stance phase; 1 instance of partial knee buckling worsened when fatigued.   Stairs Stairs: Yes Stairs assistance: Min guard Stair Management: Step to pattern;Two rails;One rail Right Number of Stairs: 5 (x4) General stair comments: Cues for technique and safety using BUEs on rails for support. Next round, worked on 1 rail on right side going sideways to simulate home. Family to have rail installed prior to d/c.   Wheelchair Mobility    Modified Rankin (Stroke Patients Only)       Balance Overall balance assessment: Needs assistance Sitting-balance support: Feet supported;No upper extremity supported Sitting balance-Leahy Scale: Good Sitting balance - Comments: supervision, able to donn brace without difficulty. Able to doff socks/donn shoes using equipment without assist.   Standing balance support: During functional activity Standing balance-Leahy Scale: Poor Standing balance comment: reliant on BUE support of  RW during dynamic tasks.  Cognition Arousal/Alertness: Awake/alert Behavior During Therapy: WFL for tasks assessed/performed Overall Cognitive Status: Within Functional Limits for tasks assessed                                        Exercises      General Comments General comments (skin integrity, edema, etc.): Wife present during session but stayed in room during stair training. Discussed in detail disposition options and what they each entail.      Pertinent Vitals/Pain Pain Assessment: Faces Faces Pain Scale: Hurts even more Pain Location: back Pain Descriptors / Indicators: Sore;Operative site guarding Pain Intervention(s): Monitored during session;Repositioned    Home Living                      Prior Function            PT Goals (current goals can now be found in the care plan section) Acute Rehab PT Goals Patient Stated Goal: get to rehab Progress towards PT goals: Progressing toward goals    Frequency    Min 5X/week      PT Plan Discharge plan needs to be updated    Co-evaluation              AM-PAC PT "6 Clicks" Mobility   Outcome Measure  Help needed turning from your back to your side while in a flat bed without using bedrails?: None Help needed moving from lying on your back to sitting on the side of a flat bed without using bedrails?: A Little Help needed moving to and from a bed to a chair (including a wheelchair)?: None Help needed standing up from a chair using your arms (e.g., wheelchair or bedside chair)?: None Help needed to walk in hospital room?: A Little Help needed climbing 3-5 steps with a railing? : A Little 6 Click Score: 21    End of Session Equipment Utilized During Treatment: Gait belt;Back brace Activity Tolerance: Patient tolerated treatment well Patient left: in chair;with call bell/phone within reach;with family/visitor present Nurse Communication:  Mobility status PT Visit Diagnosis: Unsteadiness on feet (R26.81);Other abnormalities of gait and mobility (R26.89);Muscle weakness (generalized) (M62.81);Other symptoms and signs involving the nervous system (R29.898)     Time: 9604-5409 PT Time Calculation (min) (ACUTE ONLY): 25 min  Charges:  $Gait Training: 23-37 mins                     Thomas Savage, PT, DPT Acute Rehabilitation Services Pager [phone] Office [phone]       Thomas Savage 05/30/2020, 1:07 PM

## 2020-05-31 NOTE — Discharge Summary (Signed)
Physician Discharge Summary   Thomas Savage IRJ:[id number] DOB: 24-Mar-1956 DOA: 05/23/2020  PCP: Patient, No Pcp Per  Admit date: 05/23/2020 Discharge date: 05/31/2020  Admitted From: home Disposition:  Home with outpt PT Discharging physician: Lewie Chamber, MD  Recommendations for Outpatient Follow-up:  1. Follow up with NSG   Patient discharged to home in Discharge Condition: stable CODE STATUS: Full Diet recommendation:  Diet Orders (From admission, onward)    Start     Ordered   05/30/20 0000  Diet general        05/30/20 1611          Hospital Course: Thomas Savage is a 64 y.o. male with PMH severe sciatica with right leg pain s/p multiple back surgeries, and s/p spinal stimulatory. Also hx of HTN, idiopathic neuropathy who presented with worsening of right leg pain, weakness, and some numbness.   Baseline ambulation status was able to ambulate despite chronic back pain and shooting pain to right leg, but last Friday, patient started to experience breakthrough pains, shooting leg from back to side of right thigh and right calf.  He fell several times because of the right leg pain and weakness.  Denies any trouble urinate or bowel movement no fever chills.  No loss of consciousness.   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  Failed trial of Lyrica and gabapentin for worsening of balance problems. CT lumbar spine showed no acute findings but advanced degeneration noted in L2-3 and L3-4.  He was evaluated by neurosurgery and was recommended for decompression and fusion of L2-3 and L3-4 with exploration of his prior fusion and hardware. He underwent posterior lumbar interbody fusion to L2-3 and L3-4 on 05/25/2020. Previous hardware screws were also removed  and replaced during surgery.  He began working with PT after surgery with plans for CIR or other rehab at discharge. He ultimately decided not to pursue discharge to rehab and instead elected to go home with outpatient PT.    * Lumbar radiculopathy - severe lumbar stenosis seen on CT with neuro deficits on admission of weakness/numbness notably in RLE - patient now s/p PLIF of L2-3 and L3-4 on 11/10; he notes improvement in pain and sensation already - continue post op recommendations as per NSG, appreciate assistance with patient - continue pain control  - PT/OT okay per NSG at this time; tentative plan for CIR or other rehab when bed avail. Due to the prolonged wait for CIR and rehab he instead elected to return home with outpatient PT instead   Subconjunctival hematoma, left - present since surgery; does not cross iris; no pain, photophobia, change in vision - continue monitoring and supportive care  Benign essential HTN - continue losartan, hctz - patient asked for regular diet instead of cardiac diet  Constipation-resolved as of 05/30/2020 - no BM in about 3 days now; opioids contributing  - will order scheduled miralax and senna-S - PRN mag citrate ordered as well - as of 11/14 he has had a BM and this is improving     The patient's chronic medical conditions were treated accordingly per the patient's home medication regimen except as noted.  On day of discharge, patient was felt deemed stable for discharge. Patient/family member advised to call PCP or come back to ER if  needed.   Principal Diagnosis: Lumbar radiculopathy  Discharge Diagnoses: Active Hospital Problems   Diagnosis Date Noted  . Lumbar radiculopathy 05/25/2020    Priority: High  . Subconjunctival hematoma, left 05/29/2020    Priority: Medium  . Benign essential HTN 05/25/2020    Resolved Hospital Problems   Diagnosis Date Noted Date Resolved  . Constipation 05/28/2020 05/30/2020    Discharge  Instructions    Ambulatory referral to Physical Therapy   Complete by: As directed    Diet general   Complete by: As directed    Increase activity slowly   Complete by: As directed    No wound care   Complete by: As directed    Dressing may be removed anytime. You may shower and pat dry the area of incision.     Allergies as of 05/30/2020      Reactions   Morphine And Related       Medication List    STOP taking these medications   meloxicam 15 MG tablet Commonly known as: MOBIC     TAKE these medications   acetaminophen 500 MG tablet Commonly known as: TYLENOL Take 1,000 mg by mouth every 8 (eight) hours as needed for moderate pain.   fluticasone 50 MCG/ACT nasal spray Commonly known as: FLONASE Place 1 spray into both nostrils daily as needed for allergies or rhinitis.   ibuprofen 400 MG tablet Commonly known as: ADVIL Take 1 tablet (400 mg total) by mouth every 6 (six) hours as needed for mild pain. Do not take chronically   losartan-hydrochlorothiazide 50-12.5 MG tablet Commonly known as: HYZAAR Take 1 tablet by mouth daily.   methocarbamol 750 MG tablet Commonly known as: ROBAXIN Take 750 mg by mouth every 8 (eight) hours as needed for muscle spasms.   traMADol 50 MG tablet Commonly known as: ULTRAM Take 50 mg by mouth every 6 (six) hours as needed for moderate pain.   vitamin B-12 1000 MCG tablet Commonly known as: CYANOCOBALAMIN Take 1,000 mcg by mouth daily.       Follow-up Information    [facility] Follow up.   Specialty: Rehabilitation Why: The outpatient therapy will contact you for the first appointment Contact information: [address] [id number] [facility] [address] [phone]             Allergies  Allergen Reactions  . Morphine And Related     Consultations: Neurosurgery  Discharge Exam: BP 120/69 (BP Location: Left Arm)   Pulse 74   Temp 99.3 F (37.4 C)  (Oral)   Resp 18   Ht 6\' 2"  (1.88 m)   Wt 131.5 kg   SpO2 100%   BMI 37.23 kg/m  General appearance: Pleasant adult man sitting up in chair Head: Normocephalic, without obvious abnormality, atraumatic Eyes: EOMI Lungs: clear to auscultation bilaterally Heart: regular rate and rhythm and S1, S2 normal Abdomen: Obese, soft, nontender, bowel sounds normal now Extremities: No obvious edema.  Sensation improved in right lower extremity almost equal with left lower extremity Skin: mobility and turgor normal Neurologic: Improving sensation in right lower extremity.  Strength with plantar flexion almost equal in both lower extremities  The results of significant diagnostics from this hospitalization (including imaging, microbiology, ancillary and laboratory) are listed below for reference.   Microbiology: Recent Results (from the past 240 hour(s))  Respiratory Panel by RT PCR (Flu A&B, Covid) - Nasopharyngeal Swab     Status: None   Collection Time: 05/24/20  3:53 AM   Specimen: Nasopharyngeal Swab  Result Value Ref Range Status   SARS Coronavirus 2 by RT PCR NEGATIVE NEGATIVE Final    Comment: (NOTE) SARS-CoV-2 target nucleic acids are NOT DETECTED.  The SARS-CoV-2 RNA is generally detectable in upper respiratoy specimens during the acute phase of infection. The lowest concentration of SARS-CoV-2 viral copies this assay can detect is 131 copies/mL. A negative result does not preclude SARS-Cov-2 infection and should not be used as the sole basis for treatment or other patient management decisions. A negative result may occur with  improper specimen collection/handling, submission of specimen other than nasopharyngeal swab, presence of viral mutation(s) within the areas targeted by this assay, and inadequate number of viral copies (<131 copies/mL). A negative result must be combined with clinical observations, patient history, and epidemiological information. The expected result is  Negative.  Fact Sheet for Patients:  https://www.moore.com/  Fact Sheet for Healthcare Providers:  https://www.young.biz/  This test is no t yet approved or cleared by the [place] FDA and  has been authorized for detection and/or diagnosis of SARS-CoV-2 by FDA under an Emergency Use Authorization (EUA). This EUA will remain  in effect (meaning this test can be used) for the duration of the COVID-19 declaration under Section 564(b)(1) of the Act, 21 U.S.C. section 360bbb-3(b)(1), unless the authorization is terminated or revoked sooner.     Influenza A by PCR NEGATIVE NEGATIVE Final   Influenza B by PCR NEGATIVE NEGATIVE Final    Comment: (NOTE) The Xpert Xpress SARS-CoV-2/FLU/RSV assay is intended as an aid in  the diagnosis of influenza from Nasopharyngeal swab specimens and  should not be used as a sole basis for treatment. Nasal washings and  aspirates are unacceptable for Xpert Xpress SARS-CoV-2/FLU/RSV  testing.  Fact Sheet for Patients: https://www.moore.com/  Fact Sheet for Healthcare Providers: https://www.young.biz/  This test is not yet approved or cleared by the [place] FDA and  has been authorized for detection and/or diagnosis of SARS-CoV-2 by  FDA under an Emergency Use Authorization (EUA). This EUA will remain  in effect (meaning this test can be used) for the duration of the  Covid-19 declaration under Section 564(b)(1) of the Act, 21  U.S.C. section 360bbb-3(b)(1), unless the authorization is  terminated or revoked. Performed at [facility], [address], [place], [place] [address]   Surgical PCR screen     Status: Abnormal   Collection Time: 05/24/20  9:58 PM   Specimen: Nasal Mucosa; Nasal Swab  Result Value Ref Range Status   MRSA, PCR NEGATIVE NEGATIVE Final   Staphylococcus aureus POSITIVE (A) NEGATIVE Final    Comment: (NOTE) The Xpert SA Assay (FDA  approved for NASAL specimens in patients 40 years of age and older), is one component of a comprehensive surveillance program. It is not intended to diagnose infection nor to guide or monitor treatment. Performed at [facility], [address], [place], [place] [address]      Labs: BNP (last 3 results) No results for input(s): BNP in the last 8760 hours. Basic Metabolic Panel: Recent Labs  Lab 05/25/20 1345 05/26/20 0029 05/27/20 0251 05/28/20 0226  NA 138 138 136 135  K 4.3 4.1 3.7 3.1*  CL 101 101 103 101  CO2  --  GLUCOSE 157* 131* 124* 109*  BUN 24* 22 24* 22  CREATININE 1.00 1.29* 1.19 1.06  CALCIUM  --  8.2* 7.8* 7.8*  MG  --  1.9 2.1 2.4   Liver Function Tests: No results for input(s): AST, ALT, ALKPHOS, BILITOT, PROT, ALBUMIN in the last 168 hours. No results for input(s): LIPASE, AMYLASE in the last 168 hours. No results for input(s): AMMONIA in the last 168 hours. CBC: Recent Labs  Lab 05/25/20 1345 05/26/20 0029 05/27/20 0251 05/28/20 0226  WBC  --  13.3* 10.0 8.8  NEUTROABS  --  9.8* 5.6 4.7  HGB 12.9* 11.8* 9.4* 9.2*  HCT 38.0* 36.2* 29.1* 28.4*  MCV  --  91.9 92.4 92.5  PLT  --  191 148* 157   Cardiac Enzymes: No results for input(s): CKTOTAL, CKMB, CKMBINDEX, TROPONINI in the last 168 hours. BNP: Invalid input(s): POCBNP CBG: No results for input(s): GLUCAP in the last 168 hours. D-Dimer No results for input(s): DDIMER in the last 72 hours. Hgb A1c No results for input(s): HGBA1C in the last 72 hours. Lipid Profile No results for input(s): CHOL, HDL, LDLCALC, TRIG, CHOLHDL, LDLDIRECT in the last 72 hours. Thyroid function studies No results for input(s): TSH, T4TOTAL, T3FREE, THYROIDAB in the last 72 hours.  Invalid input(s): FREET3 Anemia work up No results for input(s): VITAMINB12, FOLATE, FERRITIN, TIBC, IRON, RETICCTPCT in the last 72 hours. Urinalysis    Component Value Date/Time   COLORURINE YELLOW 05/24/2020  0834   APPEARANCEUR CLEAR 05/24/2020 0834   LABSPEC >1.046 (H) 05/24/2020 0834   PHURINE 5.0 05/24/2020 0834   GLUCOSEU NEGATIVE 05/24/2020 0834   HGBUR NEGATIVE 05/24/2020 0834   BILIRUBINUR NEGATIVE 05/24/2020 0834   KETONESUR NEGATIVE 05/24/2020 0834   PROTEINUR NEGATIVE 05/24/2020 0834   NITRITE NEGATIVE 05/24/2020 0834   LEUKOCYTESUR NEGATIVE 05/24/2020 0834   Sepsis Labs Invalid input(s): PROCALCITONIN,  WBC,  LACTICIDVEN Microbiology Recent Results (from the past 240 hour(s))  Respiratory Panel by RT PCR (Flu A&B, Covid) - Nasopharyngeal Swab     Status: None   Collection Time: 05/24/20  3:53 AM   Specimen: Nasopharyngeal Swab  Result Value Ref Range Status   SARS Coronavirus 2 by RT PCR NEGATIVE NEGATIVE Final    Comment: (NOTE) SARS-CoV-2 target nucleic acids are NOT DETECTED.  The SARS-CoV-2 RNA is generally detectable in upper respiratoy specimens during the acute phase of infection. The lowest concentration of SARS-CoV-2 viral copies this assay can detect is 131 copies/mL. A negative result does not preclude SARS-Cov-2 infection and should not be used as the sole basis for treatment or other patient management decisions. A negative result may occur with  improper specimen collection/handling, submission of specimen other than nasopharyngeal swab, presence of viral mutation(s) within the areas targeted by this assay, and inadequate number of viral copies (<131 copies/mL). A negative result must be combined with clinical observations, patient history, and epidemiological information. The expected result is Negative.  Fact Sheet for Patients:  https://www.moore.com/https://www.fda.gov/media/142436/download  Fact Sheet for Healthcare Providers:  https://www.young.biz/https://www.fda.gov/media/142435/download  This test is no t yet approved or cleared by the [place] FDA and  has been authorized for detection and/or diagnosis of SARS-CoV-2 by FDA under an Emergency Use Authorization (EUA). This EUA will  remain  in effect (meaning this test can be used) for the duration of the COVID-19 declaration under Section 564(b)(1) of the Act, 21 U.S.C. section 360bbb-3(b)(1), unless the authorization is terminated or revoked sooner.     Influenza A by PCR NEGATIVE NEGATIVE Final   Influenza B by PCR NEGATIVE NEGATIVE Final    Comment: (NOTE) The Xpert Xpress SARS-CoV-2/FLU/RSV assay is intended as an aid in  the diagnosis of influenza from Nasopharyngeal swab specimens and  should not be used as a sole basis for treatment. Nasal washings and  aspirates are unacceptable for Xpert Xpress SARS-CoV-2/FLU/RSV  testing.  Fact Sheet for Patients: https://www.moore.com/  Fact Sheet for Healthcare Providers: https://www.young.biz/  This test is not yet approved or cleared by the [place] FDA and  has been authorized for detection and/or diagnosis of SARS-CoV-2 by  FDA under an Emergency Use Authorization (EUA). This EUA will remain  in effect (meaning this test can be used) for the duration of the  Covid-19 declaration under Section 564(b)(1) of the Act, 21  U.S.C. section 360bbb-3(b)(1), unless the authorization is  terminated or revoked. Performed at [facility], [address] [place], [place] [address]   Surgical PCR screen     Status: Abnormal   Collection Time: 05/24/20  9:58 PM   Specimen: Nasal Mucosa; Nasal Swab  Result Value Ref Range Status   MRSA, PCR NEGATIVE NEGATIVE Final   Staphylococcus aureus POSITIVE (A) NEGATIVE Final    Comment: (NOTE) The Xpert SA Assay (FDA approved for NASAL specimens in patients 9 years of age and older), is one component of a comprehensive surveillance program. It is not intended to diagnose infection nor to guide or monitor treatment. Performed at [facility], [address] [place], [place] [address]     Procedures/Studies: DG Lumbar Spine 2-3 Views  Result Date:  05/25/2020 CLINICAL DATA:  L2-L4 PLIF revision EXAM: LUMBAR SPINE - 2-3 VIEW; DG C-ARM 1-60 MIN COMPARISON:  05/23/2020 FLUOROSCOPY TIME:  1 minutes 1 second Dose: 72.68 mGy Images: 3 FINDINGS: Five lumbar vertebra by prior exam. Prior posterior fusion L4-S1. Pedicle screws now identified at L2 and L3 in addition to L4-S1. New disc prostheses at L2-L3 and L3-L4. Bones demineralized. No fracture or subluxation. IMPRESSION: Postsurgical changes of posterior L2-L4 fusion. Prior L4-S1 fusion. Electronically Signed   By: Ulyses Southward M.D.   On: 05/25/2020 14:35   DG Lumbar Spine Complete  Result Date: 05/23/2020 CLINICAL DATA:  Pain EXAM: LUMBAR SPINE - COMPLETE 4+ VIEW COMPARISON:  None. FINDINGS: There is no evidence of lumbar spine fracture. Alignment is normal. Lower lumbar spine fixation hardware seen at L4 through S1. There is an interbody fusion seen at L4-L5 which appears slightly inferiorly position when in comparison to a prior exam dating back to 2018. Anterior lumbar interbody fusion seen at L5-S1. Overlying spinal stimulator is noted. Mild disc height loss and facet arthrosis seen in the lower lumbar spine. IMPRESSION: 1. No acute osseous abnormality. 2. Status post interbody fusion and posterior fixation from L4 through S1. Interbody fusion L4-L5 appears to be slightly inferiorly position when compared to prior exam and if further evaluation is required would recommend CT to determine hardware complication. Electronically Signed   By: Jonna Clark M.D.   On: 05/23/2020 17:11   CT LUMBAR SPINE W CONTRAST  Result Date: 05/24/2020 CLINICAL DATA:  New onset right leg pain and numbness. History of back pain with neurostimulator in place EXAM: CT LUMBAR SPINE WITH CONTRAST TECHNIQUE: Multidetector CT imaging of the lumbar spine was performed with intravenous contrast administration. CONTRAST:  100 cc Omnipaque 300 intravenous COMPARISON:  01/08/2019 lumbar MRI FINDINGS: Segmentation: 5 lumbar type  vertebrae Alignment: Fused L5-S1 anterolisthesis Vertebrae: L4-5 and L5-S1 posterior-lateral fusion with rod and pedicle screw. Discectomy cages at L4-5 and L5-S1. Bridging bone is not seen at L4-5 and there is L4 and S1 screw loosening.  Intervertebral gas is seen at the L5-S1 disc space but there is bridging bone demonstrated best on coronal reformats. The ALIF cage shows rotation and subsidence. Scattered sclerotic foci compatible with bone islands. No evidence of acute fracture.  No aggressive bone lesion. Paraspinal and other soft tissues: Atheromatous plaque of the aorta. Disc levels: T12- L1: Unremarkable. L1-L2: Unremarkable. L2-L3: Disc narrowing and bulging. Facet osteoarthritis with spurring and ligamentum flavum thickening. High-grade appearing spinal stenosis. Moderate left and mild right foraminal narrowing L3-L4: Facet osteoarthritis with bony and ligamentous hypertrophy. The disc is narrowed and bulging and there is high-grade appearing spinal stenosis, with some limitation by streak artifact from hardware. Likely moderate bilateral foraminal narrowing L4-L5: PLIF with no convincing arthrodesis. There is left paracentral ridging which could impinge on the descending L5 nerve root. Laminectomy. Mild left foraminal narrowing L5-S1:Fusion with prominent subsidence of the cage and spurring causing mild to moderate bilateral foraminal stenosis. The spinal canal is likely patent after laminectomy. IMPRESSION: 1. No acute finding. 2. Advanced degenerative spinal stenosis at L2-3 and L3-4, also seen on a 2020 lumbar MRI. 3. L4-5 and L5-S1 fusion.  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 Electronically Signed   By: Marnee Spring M.D.   On: 05/24/2020 05:59   DG C-Arm 1-60 Min  Result Date: 05/25/2020 CLINICAL DATA:  L2-L4 PLIF revision EXAM: LUMBAR SPINE - 2-3 VIEW; DG C-ARM 1-60 MIN COMPARISON:  05/23/2020 FLUOROSCOPY TIME:  1 minutes 1 second Dose: 72.68 mGy Images: 3 FINDINGS: Five lumbar vertebra  by prior exam. Prior posterior fusion L4-S1. Pedicle screws now identified at L2 and L3 in addition to L4-S1. New disc prostheses at L2-L3 and L3-L4. Bones demineralized. No fracture or subluxation. IMPRESSION: Postsurgical changes of posterior L2-L4 fusion. Prior L4-S1 fusion. Electronically Signed   By: Ulyses Southward M.D.   On: 05/25/2020 14:35     Time coordinating discharge: Over 30 minutes    Lewie Chamber, MD  [facility] 05/31/2020, 3:21 PM

## 2020-06-01 ENCOUNTER — Encounter: Payer: Self-pay | Admitting: Physical Therapy

## 2020-06-01 ENCOUNTER — Other Ambulatory Visit: Payer: Self-pay

## 2020-06-01 ENCOUNTER — Ambulatory Visit: Payer: Commercial Managed Care - PPO | Admitting: Physical Therapy

## 2020-06-01 ENCOUNTER — Ambulatory Visit: Payer: Commercial Managed Care - PPO | Attending: Neurosurgery | Admitting: Physical Therapy

## 2020-06-01 DIAGNOSIS — M6281 Muscle weakness (generalized): Secondary | ICD-10-CM

## 2020-06-01 DIAGNOSIS — M5441 Lumbago with sciatica, right side: Secondary | ICD-10-CM

## 2020-06-01 DIAGNOSIS — M62830 Muscle spasm of back: Secondary | ICD-10-CM

## 2020-06-01 DIAGNOSIS — R262 Difficulty in walking, not elsewhere classified: Secondary | ICD-10-CM

## 2020-06-01 NOTE — Patient Instructions (Signed)
Access Code: [email or web address] URL: [email or web address] Date: 06/01/2020 Prepared by: Stacie Glaze  Exercises Seated Long Arc Quad - 2 x daily - 7 x weekly - 2 sets - 10 reps - 2 hold Seated March - 2 x daily - 7 x weekly - 2 sets - 10 reps - 2 hold Seated Toe Raise - 2 x daily - 7 x weekly - 2 sets - 10 reps - 2 hold Seated Heel Raise - 2 x daily - 7 x weekly - 2 sets - 10 reps - 2 hold

## 2020-06-01 NOTE — Therapy (Signed)
[facility] [place] [facility] [address] [place], [place], [address] Phone: [phone]   Fax:  [phone]  Physical Therapy Evaluation  Patient Details  Name: Thomas Savage MRN: [id number] Date of Birth: October 12, 1955 Referring Provider (PT): Jaclynn Major Date: 06/01/2020   PT End of Session - 06/01/20 1658    Visit Number 1    Number of Visits 40    Date for PT Re-Evaluation 08/01/20    PT Start Time 1615    PT Stop Time 1700    PT Time Calculation (min) 45 min    Equipment Utilized During Treatment Gait belt;Back brace    Activity Tolerance Patient tolerated treatment well;Patient limited by pain    Behavior During Therapy [facility] for tasks assessed/performed           Past Medical History:  Diagnosis Date  . Back pain     Past Surgical History:  Procedure Laterality Date  . BACK SURGERY  12/31/2016   Oblique Lateral Interbody Fusion with Allograft Dr. Alferd Patee  . LUMBAR FUSION  11/21/2016   L4-S1 Posterior Spinal Fusion   . SPINAL CORD STIMULATOR IMPLANT Left 10/14/2019   Dr. Petra Kuba  . SPINAL CORD STIMULATOR TRIAL  07/01/2019   Dr. Petra Kuba    There were no vitals filed for this visit.    Subjective Assessment - 06/01/20 1618    Subjective Patient started with a fusion in 2018, a few weeks later he developed and infection and they had to remove the hardware and replace.  He underwent lumbar fusion multi level on 05/25/20.  He was in the hospital until 05/30/20.  He reports that he feels really weak and reports having a very difficult time moving.  He reports that this surgery was due to pain and a loss of strength and function of the right leg    Pertinent History neuropathy both legs    Limitations Sitting;Lifting;Standing;Walking;House hold activities    How long can you stand comfortably? 2 minutes    How long can you walk comfortably? 1 minutes    Patient Stated Goals walk without a device, be stronger,  have less pain    Currently in Pain? Yes    Pain Score 8     Pain Location Back    Pain Orientation Lower    Pain Descriptors / Indicators Burning;Aching    Pain Type Surgical pain;Acute pain    Pain Radiating Towards reports numb in the right thigh    Pain Onset More than a month ago    Pain Frequency Constant    Aggravating Factors  standing, walking, any bending pain up to 10/10    Pain Relieving Factors can get comfortable in bed with Tylenol and Ibuprofen at best pain a 5/10    Effect of Pain on Daily Activities limits everything              [facility] PT Assessment - 06/01/20 0001      Assessment   Medical Diagnosis multi level posterior lumbar fusion    Referring Provider (PT) Venetia Maxon    Onset Date/Surgical Date 05/25/20    Prior Therapy no      Precautions   Precaution Comments No bending, lifting, twisting, no lifting > 8#      Balance Screen   Has the patient fallen in the past 6 months Yes    How many times? 6    Has the patient had a decrease in activity level because  of a fear of falling?  Yes    Is the patient reluctant to leave their home because of a fear of falling?  Yes      Home Environment   Additional Comments has stairs, was able to do housework and some yardwork      Prior Function   Level of Independence Independent    Vocation Retired    Leisure no exercise      Sensation   Additional Comments reports decreased sensation in the right shin and calf area      ROM / Strength   AROM / PROM / Strength Strength;AROM      AROM   AROM Assessment Site Knee    Right/Left Knee Right    Right Knee Extension 20    Right Knee Flexion 110      Strength   Strength Assessment Site Knee;Hip;Ankle    Right/Left Hip Right;Left    Right Hip Flexion 3/5    Right Hip Extension 2+/5    Right Hip External Rotation  3/5    Right Hip Internal Rotation 3/5    Right Hip ABduction 4-/5    Right Hip ADduction 4-/5    Right/Left Knee Right    Right Knee Flexion  3+/5    Right Knee Extension 3-/5    Right/Left Ankle Right;Left    Right Ankle Dorsiflexion 3-/5    Right Ankle Plantar Flexion 3-/5    Right Ankle Inversion 3+/5    Right Ankle Eversion 3+/5    Left Ankle Dorsiflexion 4-/5    Left Ankle Plantar Flexion 3+/5    Left Ankle Inversion 4-/5    Left Ankle Eversion 3+/5      Flexibility   Soft Tissue Assessment /Muscle Length --   has some neural tension in the right leg, HS and gluteal      Ambulation/Gait   Gait Comments very slow, step to gait, right knee buckles at times       Standardized Balance Assessment   Standardized Balance Assessment Timed Up and Go Test      Timed Up and Go Test   Normal TUG (seconds) 55    TUG Comments FWW                      Objective measurements completed on examination: See above findings.       [facility] Adult PT Treatment/Exercise - 06/01/20 0001      Exercises   Exercises Lumbar      Lumbar Exercises: Aerobic   Nustep level 4 x 5 minutes                    PT [place]z Term Goals - 06/01/20 1703      PT [place] TERM GOAL #1   Title independent with intiial HEP    Time 2    Period Weeks    Status New             PT Long Term Goals - 06/01/20 1708      PT LONG TERM GOAL #1   Title understand posture and body mechanics    Time 12    Period Weeks    Status New      PT LONG TERM GOAL #2   Title decrease back pain overall 50%    Time 12    Period Weeks    Status New      PT LONG TERM GOAL #3   Title increase  right knee extension to 5 degrees from full extension    Time 12    Period Weeks    Status New      PT LONG TERM GOAL #4   Title walk with SPC or less restrictive device x 500 feet    Time 12    Period Weeks    Status New      PT LONG TERM GOAL #5   Title increase right hip flexion strength to 4/5    Time 12    Period Weeks    Status New                  Plan - 06/01/20 1659    Clinical Impression Statement Patient had  original lumbar fusion in 2018, reports that he developed an infection and a few weeks later all hardware was removed and replaced.  He reports that over the past 6 months he started having falls due to right knee giving out, he reports that he has neuropathy and has right LE weakness and loss of sensation.  He underwent multi level lumbar fusion 05/25/20 and was in the hospital 6 days.  He reports feeling very weak and having a hard time moving.  He would like to be able to tolerate walking and moving, be stronger have less pain and not have to use a walker    Stability/Clinical Decision Making Evolving/Moderate complexity    Clinical Decision Making Moderate    Rehab Potential Good    PT Frequency 2x / week    PT Duration 12 weeks    PT Treatment/Interventions ADLs/Self Care Home Management;Cryotherapy;Moist Heat;Gait training;Functional mobility training;Therapeutic activities;Therapeutic exercise;Balance training;Neuromuscular re-education;Patient/family education;Manual techniques    PT Next Visit Plan slowly get him moving, work on LE strength and his safety with walking    Consulted and Agree with Plan of Care Patient           Patient will benefit from skilled therapeutic intervention in order to improve the following deficits and impairments:  Abnormal gait, Decreased range of motion, Difficulty walking, Increased muscle spasms, Decreased activity tolerance, Pain, Improper body mechanics, Impaired flexibility, Decreased balance, Decreased mobility, Decreased strength, Postural dysfunction  Visit Diagnosis: Acute bilateral low back pain with right-sided sciatica - Plan: PT plan of care cert/re-cert  Muscle spasm of back - Plan: PT plan of care cert/re-cert  Difficulty in walking, not elsewhere classified - Plan: PT plan of care cert/re-cert  Muscle weakness (generalized) - Plan: PT plan of care cert/re-cert     Problem List Patient Active Problem List   Diagnosis Date Noted  .  Subconjunctival hematoma, left 05/29/2020  . Lumbar radiculopathy 05/25/2020  . Benign essential HTN 05/25/2020  . Impaired ambulation 05/24/2020  . Right-sided low back pain with right-sided sciatica     [name], PT 06/01/2020, 5:12 PM  [facility] [place] [facility] [address] [place], [place], [address] Phone: [phone]   Fax:  [phone]  Name: JOSH NICOLOSI MRN: [id number] Date of Birth: 08-11-1955

## 2020-06-07 ENCOUNTER — Other Ambulatory Visit: Payer: Self-pay

## 2020-06-07 ENCOUNTER — Encounter: Payer: Self-pay | Admitting: Physical Therapy

## 2020-06-07 ENCOUNTER — Ambulatory Visit: Payer: Commercial Managed Care - PPO | Admitting: Physical Therapy

## 2020-06-07 DIAGNOSIS — M6281 Muscle weakness (generalized): Secondary | ICD-10-CM

## 2020-06-07 DIAGNOSIS — M62830 Muscle spasm of back: Secondary | ICD-10-CM

## 2020-06-07 DIAGNOSIS — R262 Difficulty in walking, not elsewhere classified: Secondary | ICD-10-CM

## 2020-06-07 DIAGNOSIS — M5441 Lumbago with sciatica, right side: Secondary | ICD-10-CM

## 2020-06-07 NOTE — Therapy (Signed)
[facility] [place] [facility] [address] [place], [place], [address] Phone: [phone]   Fax:  [phone]  Physical Therapy Treatment  Patient Details  Name: Thomas Savage MRN: [id number] Date of Birth: 24-Apr-1956 Referring Provider (PT): Jaclynn Major Date: 06/07/2020   PT End of Session - 06/07/20 0833    Visit Number 2    Number of Visits 40    Date for PT Re-Evaluation 08/01/20    PT Start Time 0800    PT Stop Time 0837    PT Time Calculation (min) 37 min    Equipment Utilized During Treatment Back brace    Activity Tolerance Patient tolerated treatment well;Patient limited by pain    Behavior During Therapy [facility] for tasks assessed/performed           Past Medical History:  Diagnosis Date  . Back pain     Past Surgical History:  Procedure Laterality Date  . BACK SURGERY  12/31/2016   Oblique Lateral Interbody Fusion with Allograft Dr. Alferd Patee  . LUMBAR FUSION  11/21/2016   L4-S1 Posterior Spinal Fusion   . SPINAL CORD STIMULATOR IMPLANT Left 10/14/2019   Dr. Petra Kuba  . SPINAL CORD STIMULATOR TRIAL  07/01/2019   Dr. Petra Kuba    There were no vitals filed for this visit.   Subjective Assessment - 06/07/20 0803    Subjective "Moving slow" Pt report some that he thinks he has hemarthrosis in R knee, reports having it before and experiencing the same symptoms.    Currently in Pain? Yes    Pain Score 7     Pain Location Knee   Back 6.5/10   Pain Orientation Right                             [facility] Adult PT Treatment/Exercise - 06/07/20 0001      Exercises   Exercises Knee/Hip      Lumbar Exercises: Aerobic   UBE (Upper Arm Bike) L1 x 3 min each      Lumbar Exercises: Seated   Sit to Stand 5 reps   CGA for balance when standing   Other Seated Lumbar Exercises Green Tband HS curls 2x10; OHP yellow ball for core 2x10     Other Seated Lumbar Exercises Rows Green Tband 2x15, AB set  with ball 2x10      Knee/Hip Exercises: Seated   Long Arc Quad 1 set;10 reps;Both    Ball Squeeze 2x10 3 sec hold     Marching Both;2 sets;5 reps;Strengthening;AROM                    PT [place] Term Goals - 06/07/20 0840      PT [place] TERM GOAL #1   Title independent with intiial HEP    Status Achieved             PT Long Term Goals - 06/01/20 1708      PT LONG TERM GOAL #1   Title understand posture and body mechanics    Time 12    Period Weeks    Status New      PT LONG TERM GOAL #2   Title decrease back pain overall 50%    Time 12    Period Weeks    Status New      PT LONG TERM GOAL #3   Title increase right knee extension to 5 degrees  from full extension    Time 12    Period Weeks    Status New      PT LONG TERM GOAL #4   Title walk with SPC or less restrictive device x 500 feet    Time 12    Period Weeks    Status New      PT LONG TERM GOAL #5   Title increase right hip flexion strength to 4/5    Time 12    Period Weeks    Status New                 Plan - 06/07/20 7616    Clinical Impression Statement Pt tolerated an initial progression toe TE fair. He has difficulty controlling decent's with sit to stands and well as maintaining balance when standing. He reports needing both knee replaced and having neuropathy in both legs making his balance difficult. He stated some difficulty with LAQ on RLE reports quad numbness and knee swelling. Pt spent most of the time seated with UE support due to core weakness. RLE present throughout session with march and HS curls.    Stability/Clinical Decision Making Evolving/Moderate complexity    Rehab Potential Good    PT Frequency 2x / week    PT Duration 12 weeks    PT Treatment/Interventions ADLs/Self Care Home Management;Cryotherapy;Moist Heat;Gait training;Functional mobility training;Therapeutic activities;Therapeutic exercise;Balance training;Neuromuscular re-education;Patient/family  education;Manual techniques    PT Next Visit Plan slowly get him moving, work on LE strength and his safety with walking           Patient will benefit from skilled therapeutic intervention in order to improve the following deficits and impairments:  Abnormal gait, Decreased range of motion, Difficulty walking, Increased muscle spasms, Decreased activity tolerance, Pain, Improper body mechanics, Impaired flexibility, Decreased balance, Decreased mobility, Decreased strength, Postural dysfunction  Visit Diagnosis: Acute bilateral low back pain with right-sided sciatica  Muscle spasm of back  Difficulty in walking, not elsewhere classified  Muscle weakness (generalized)     Problem List Patient Active Problem List   Diagnosis Date Noted  . Subconjunctival hematoma, left 05/29/2020  . Lumbar radiculopathy 05/25/2020  . Benign essential HTN 05/25/2020  . Impaired ambulation 05/24/2020  . Right-sided low back pain with right-sided sciatica     Grayce Sessions 06/07/2020, 8:40 AM  [facility] [place] [facility] [address] [place], [place], [address] Phone: [phone]   Fax:  [phone]  Name: Thomas Savage MRN: [id number] Date of Birth: 04/20/1956

## 2020-06-14 ENCOUNTER — Ambulatory Visit: Payer: Commercial Managed Care - PPO | Admitting: Physical Therapy

## 2020-06-14 ENCOUNTER — Other Ambulatory Visit: Payer: Self-pay

## 2020-06-14 ENCOUNTER — Encounter: Payer: Self-pay | Admitting: Physical Therapy

## 2020-06-14 DIAGNOSIS — M62830 Muscle spasm of back: Secondary | ICD-10-CM

## 2020-06-14 DIAGNOSIS — M5441 Lumbago with sciatica, right side: Secondary | ICD-10-CM

## 2020-06-14 DIAGNOSIS — M6281 Muscle weakness (generalized): Secondary | ICD-10-CM

## 2020-06-14 DIAGNOSIS — R262 Difficulty in walking, not elsewhere classified: Secondary | ICD-10-CM

## 2020-06-14 NOTE — Therapy (Signed)
[facility] [place] [facility] [address] [place], [place], [address] Phone: [phone]   Fax:  [phone]  Physical Therapy Treatment  Patient Details  Name: Thomas Savage MRN: [id number] Date of Birth: [birth year] Referring Provider (PT): Jaclynn Major Date: 06/14/2020   PT End of Session - 06/14/20 1425    Visit Number 3    Number of Visits 40    Date for PT Re-Evaluation 08/01/20    PT Start Time 1345    PT Stop Time 1427    PT Time Calculation (min) 42 min    Equipment Utilized During Treatment Back brace    Activity Tolerance Patient tolerated treatment well;Patient limited by pain    Behavior During Therapy [facility] for tasks assessed/performed           Past Medical History:  Diagnosis Date  . Back pain     Past Surgical History:  Procedure Laterality Date  . BACK SURGERY  12/31/2016   Oblique Lateral Interbody Fusion with Allograft Dr. Alferd Patee  . LUMBAR FUSION  11/21/2016   L4-S1 Posterior Spinal Fusion   . SPINAL CORD STIMULATOR IMPLANT Left 10/14/2019   Dr. Petra Kuba  . SPINAL CORD STIMULATOR TRIAL  07/01/2019   Dr. Petra Kuba    There were no vitals filed for this visit.   Subjective Assessment - 06/14/20 1345    Subjective "No walker today" No getting a lot of sleep because he cant get comfortable. Seen the MD, he thinks a gait in his back may have slipped a little    Currently in Pain? Yes    Pain Score 6     Pain Location Back   And R knee                            [facility] Adult PT Treatment/Exercise - 06/14/20 0001      Ambulation/Gait   Gait Comments Gait with SPC in LUE      Lumbar Exercises: Aerobic   UBE (Upper Arm Bike) L1 x 3 min each      Lumbar Exercises: Seated   Sit to Stand 5 reps    Other Seated Lumbar Exercises Green Tband HS curls 2x15; OHP yellow ball for core 2x10       Knee/Hip Exercises: Seated   Long Arc Quad 1 set;10 reps;Both    Long Arc Quad Weight  1 lbs.    Other Seated Knee/Hip Exercises Fitter press RLE 2 blue 2x20     Marching Both;2 sets;10 reps    Marching Limitations 1lb                    PT [place] Term Goals - 06/07/20 0840      PT Sh[place] TERM GOAL #1   Title independent with intiial HEP    Status Achieved             PT Long Term Goals - 06/01/20 1708      PT LONG TERM GOAL #1   Title understand posture and body mechanics    Time 12    Period Weeks    Status New      PT LONG TERM GOAL #2   Title decrease back pain overall 50%    Time 12    Period Weeks    Status New      PT LONG TERM GOAL #3   Title increase right knee extension  to 5 degrees from full extension    Time 12    Period Weeks    Status New      PT LONG TERM GOAL #4   Title walk with SPC or less restrictive device x 500 feet    Time 12    Period Weeks    Status New      PT LONG TERM GOAL #5   Title increase right hip flexion strength to 4/5    Time 12    Period Weeks    Status New                 Plan - 06/14/20 1425    Clinical Impression Statement Pt enters clinic ambulating with R knee locked and with SPC in RUE. Pt stated he feel like his R leg will give out on him. Worked on gait with SPC in LUE but pt felt too uncomfortable. R hip flexor cramping after sit to stands, he stated that this was th first occurrence with this. Unsteadiness remains with sit to stands. Some pain with RLE LAQ in medial R thigh.    Stability/Clinical Decision Making Evolving/Moderate complexity    Rehab Potential Good    PT Frequency 2x / week    PT Duration 12 weeks    PT Treatment/Interventions ADLs/Self Care Home Management;Cryotherapy;Moist Heat;Gait training;Functional mobility training;Therapeutic activities;Therapeutic exercise;Balance training;Neuromuscular re-education;Patient/family education;Manual techniques    PT Next Visit Plan slowly get him moving, work on LE strength and his safety with walking           Patient  will benefit from skilled therapeutic intervention in order to improve the following deficits and impairments:  Abnormal gait, Decreased range of motion, Difficulty walking, Increased muscle spasms, Decreased activity tolerance, Pain, Improper body mechanics, Impaired flexibility, Decreased balance, Decreased mobility, Decreased strength, Postural dysfunction  Visit Diagnosis: Acute bilateral low back pain with right-sided sciatica  Muscle spasm of back  Difficulty in walking, not elsewhere classified  Muscle weakness (generalized)     Problem List Patient Active Problem List   Diagnosis Date Noted  . Subconjunctival hematoma, left 05/29/2020  . Lumbar radiculopathy 05/25/2020  . Benign essential HTN 05/25/2020  . Impaired ambulation 05/24/2020  . Right-sided low back pain with right-sided sciatica     Grayce Sessions, PTA 06/14/2020, 2:30 PM  [facility] [place] [facility] [address] [place], [place], [address] Phone: [phone]   Fax:  [phone]  Name: Thomas Savage MRN: [id number] Date of Birth: [birth year]

## 2020-06-16 ENCOUNTER — Other Ambulatory Visit: Payer: Self-pay

## 2020-06-16 ENCOUNTER — Encounter: Payer: Self-pay | Admitting: Physical Therapy

## 2020-06-16 ENCOUNTER — Ambulatory Visit: Payer: Commercial Managed Care - PPO | Attending: Neurosurgery | Admitting: Physical Therapy

## 2020-06-16 DIAGNOSIS — M5441 Lumbago with sciatica, right side: Secondary | ICD-10-CM | POA: Diagnosis not present

## 2020-06-16 DIAGNOSIS — R262 Difficulty in walking, not elsewhere classified: Secondary | ICD-10-CM | POA: Diagnosis present

## 2020-06-16 DIAGNOSIS — H1132 Conjunctival hemorrhage, left eye: Secondary | ICD-10-CM | POA: Insufficient documentation

## 2020-06-16 DIAGNOSIS — M62830 Muscle spasm of back: Secondary | ICD-10-CM | POA: Insufficient documentation

## 2020-06-16 DIAGNOSIS — M6281 Muscle weakness (generalized): Secondary | ICD-10-CM | POA: Diagnosis present

## 2020-06-16 NOTE — Therapy (Signed)
[facility] [place] [facility] [address] [place], [place], [address] Phone: [phone]   Fax:  [phone]  Physical Therapy Treatment  Patient Details  Name: Thomas Savage MRN: [id number] Date of Birth: April 01, 1956 Referring Provider (PT): Jaclynn Major Date: 06/16/2020   PT End of Session - 06/16/20 1530    Visit Number 4    Number of Visits 40    Date for PT Re-Evaluation 08/01/20    PT Start Time 1443    PT Stop Time 1528    PT Time Calculation (min) 45 min    Activity Tolerance Patient tolerated treatment well;Patient limited by pain    Behavior During Therapy [facility] for tasks assessed/performed           Past Medical History:  Diagnosis Date  . Back pain     Past Surgical History:  Procedure Laterality Date  . BACK SURGERY  12/31/2016   Oblique Lateral Interbody Fusion with Allograft Dr. Alferd Patee  . LUMBAR FUSION  11/21/2016   L4-S1 Posterior Spinal Fusion   . SPINAL CORD STIMULATOR IMPLANT Left 10/14/2019   Dr. Petra Kuba  . SPINAL CORD STIMULATOR TRIAL  07/01/2019   Dr. Petra Kuba    There were no vitals filed for this visit.   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" he is to be "very careful and no lifting, bending and twisting"    Pain Score 6     Pain Location Back    Aggravating Factors  difficulty walking    Pain Relieving Factors narcotics help              [facility] PT Assessment - 06/16/20 0001      AROM   Right Knee Extension 5      Strength   Right Hip Extension 3/5                         [facility] Adult PT Treatment/Exercise - 06/16/20 0001      Ambulation/Gait   Gait Comments walking with two SPC's some cues for posture and sequencing, but this looked good, he had pain in the back at about 60 feet, did 3 x 60 and 1x120,  this caused tightness in the low back      Lumbar Exercises: Aerobic   Nustep level 3 x 5 minutes      Lumbar Exercises: Machines for Strengthening   Other Lumbar Machine Exercise standing 5# pulleys row 2x10, then straight arm pull with limited motion x 10      Lumbar Exercises: Seated   Other Seated Lumbar Exercises green tband HS curls 2x15 each, physioball in lap abdominal isometrics    Other Seated Lumbar Exercises bal b/n knees squeeze      Knee/Hip Exercises: Seated   Long Arc Quad Both;2 sets;10 reps    Long Arc Quad Weight 2 lbs.    Other Seated Knee/Hip Exercises green tband PF/DF and eversion 2x10 each    Other Seated Knee/Hip Exercises right LE nerve flossing    Marching Both;2 sets;10 reps    Marching Limitations 2#                    PT [place] Term Goals - 06/07/20 0840      PT [place] TERM  GOAL #1   Title independent with intiial HEP    Status Achieved             PT Long Term Goals - 06/16/20 1613      PT LONG TERM GOAL #1   Title understand posture and body mechanics    Status On-going      PT LONG TERM GOAL #2   Title decrease back pain overall 50%    Status On-going                 Plan - 06/16/20 1532    Clinical Impression Statement Patient back using the walker, had him try two canes and he did well some cues for posture and a lot of cues for sequencing, had some low back tightness with walking 60 feet, max was 120 feet, much improved ROM of the right knee since the evalutaiton.    PT Next Visit Plan continue to work on progression but no bending lifting and twisting    Consulted and Agree with Plan of Care Patient           Patient will benefit from skilled therapeutic intervention in order to improve the following deficits and impairments:  Abnormal gait, Decreased range of motion, Difficulty walking, Increased muscle spasms, Decreased activity tolerance, Pain, Improper body mechanics, Impaired flexibility, Decreased balance,  Decreased mobility, Decreased strength, Postural dysfunction  Visit Diagnosis: Acute bilateral low back pain with right-sided sciatica  Muscle spasm of back  Difficulty in walking, not elsewhere classified  Muscle weakness (generalized)     Problem List Patient Active Problem List   Diagnosis Date Noted  . Subconjunctival hematoma, left 05/29/2020  . Lumbar radiculopathy 05/25/2020  . Benign essential HTN 05/25/2020  . Impaired ambulation 05/24/2020  . Right-sided low back pain with right-sided sciatica     [name], PT 06/16/2020, 4:13 PM  [facility] [place] [facility] [address] [place], [place], [address] Phone: [phone]   Fax:  [phone]  Name: Thomas Savage MRN: [id number] Date of Birth: 25-Sep-1955

## 2020-06-21 ENCOUNTER — Encounter: Payer: Self-pay | Admitting: Physical Therapy

## 2020-06-21 ENCOUNTER — Other Ambulatory Visit: Payer: Self-pay

## 2020-06-21 ENCOUNTER — Ambulatory Visit: Payer: Commercial Managed Care - PPO | Admitting: Physical Therapy

## 2020-06-21 DIAGNOSIS — M6281 Muscle weakness (generalized): Secondary | ICD-10-CM

## 2020-06-21 DIAGNOSIS — M5441 Lumbago with sciatica, right side: Secondary | ICD-10-CM

## 2020-06-21 DIAGNOSIS — M62830 Muscle spasm of back: Secondary | ICD-10-CM

## 2020-06-21 DIAGNOSIS — R262 Difficulty in walking, not elsewhere classified: Secondary | ICD-10-CM

## 2020-06-21 NOTE — Therapy (Signed)
[facility] [place] [address] [place], [place], [address] Phone: [phone]   Fax:  [phone]  Physical Therapy Treatment  Patient Details  Name: Thomas Savage MRN: [id number] Date of Birth: [birth year] Referring Provider (PT): Jaclynn Major Date: 06/21/2020   PT End of Session - 06/21/20 1432    Visit Number 5    Date for PT Re-Evaluation 08/01/20    PT Start Time 1345    PT Stop Time 1431    PT Time Calculation (min) 46 min    Activity Tolerance Patient tolerated treatment well    Behavior During Therapy [facility] for tasks assessed/performed           Past Medical History:  Diagnosis Date  . Back pain     Past Surgical History:  Procedure Laterality Date  . BACK SURGERY  12/31/2016   Oblique Lateral Interbody Fusion with Allograft Dr. Alferd Patee  . LUMBAR FUSION  11/21/2016   L4-S1 Posterior Spinal Fusion   . SPINAL CORD STIMULATOR IMPLANT Left 10/14/2019   Dr. Petra Kuba  . SPINAL CORD STIMULATOR TRIAL  07/01/2019   Dr. Petra Kuba    There were no vitals filed for this visit.   Subjective Assessment - 06/21/20 1348    Subjective Has been more active recently, feels like he has a catch I his back that hurts    Currently in Pain? Yes    Pain Score 6     Pain Location Back    Pain Orientation Mid;Lower                             [facility] Adult PT Treatment/Exercise - 06/21/20 0001      High Level Balance   High Level Balance Activities Side stepping    High Level Balance Comments in fron of mat table       Lumbar Exercises: Aerobic   Nustep level 3 x 5 minutes    Other Aerobic Exercise        Lumbar Exercises: Standing   Row Theraband;Both;Strengthening;15 reps   x2   Theraband Level (Row) Level 2 (Red)    Shoulder Extension Strengthening;15 reps;Theraband   x2   Theraband Level (Shoulder Extension) Level 3 (Green)      Lumbar Exercises: Seated   Sit to Stand 5 reps    Other  Seated Lumbar Exercises green tband HS curls 2x15 each, physioball in lap abdominal isometrics    Other Seated Lumbar Exercises bal b/n knees squeeze      Knee/Hip Exercises: Standing   Other Standing Knee Exercises Standing marches with SPC 2x5 each      Knee/Hip Exercises: Seated   Long Arc Quad Both;2 sets;15 reps    Long Arc Quad Weight 2 lbs.    Ball Squeeze 2x10 3 sec hold     Marching Limitations 2#                    PT Reaves Term Goals - 06/07/20 0840      PT Crossno TERM GOAL #1   Title independent with intiial HEP    Status Achieved             PT Long Term Goals - 06/21/20 1433      PT LONG TERM GOAL #1   Title understand posture and body mechanics    Status On-going  Plan - 06/21/20 1433    Clinical Impression Statement Pt did well in today's therapy session.Some posterior leaning noted with sit to stands, LE touching mat table. Some low back tightness with standing activities reported today. RLE does fatigue more than the R with SL strengthening.    Stability/Clinical Decision Making Evolving/Moderate complexity    Rehab Potential Good    PT Frequency 2x / week    PT Duration 12 weeks    PT Treatment/Interventions ADLs/Self Care Home Management;Cryotherapy;Moist Heat;Gait training;Functional mobility training;Therapeutic activities;Therapeutic exercise;Balance training;Neuromuscular re-education;Patient/family education;Manual techniques    PT Next Visit Plan continue to work on progression but no bending lifting and twisting           Patient will benefit from skilled therapeutic intervention in order to improve the following deficits and impairments:  Abnormal gait, Decreased range of motion, Difficulty walking, Increased muscle spasms, Decreased activity tolerance, Pain, Improper body mechanics, Impaired flexibility, Decreased balance, Decreased mobility, Decreased strength, Postural dysfunction  Visit  Diagnosis: Difficulty in walking, not elsewhere classified  Muscle spasm of back  Acute bilateral low back pain with right-sided sciatica  Muscle weakness (generalized)     Problem List Patient Active Problem List   Diagnosis Date Noted  . Subconjunctival hematoma, left 05/29/2020  . Lumbar radiculopathy 05/25/2020  . Benign essential HTN 05/25/2020  . Impaired ambulation 05/24/2020  . Right-sided low back pain with right-sided sciatica     Grayce Sessions, PTA 06/21/2020, 2:38 PM  [facility] [place] [facility] [address] [place], [place], [address] Phone: [phone]   Fax:  [phone]  Name: DAQUANN MERRIOTT MRN: [id number] Date of Birth: 1956-04-11

## 2020-06-23 ENCOUNTER — Ambulatory Visit: Payer: Commercial Managed Care - PPO | Admitting: Physical Therapy

## 2020-06-23 ENCOUNTER — Encounter: Payer: Self-pay | Admitting: Physical Therapy

## 2020-06-23 ENCOUNTER — Other Ambulatory Visit: Payer: Self-pay

## 2020-06-23 DIAGNOSIS — M5441 Lumbago with sciatica, right side: Secondary | ICD-10-CM | POA: Diagnosis not present

## 2020-06-23 DIAGNOSIS — R262 Difficulty in walking, not elsewhere classified: Secondary | ICD-10-CM

## 2020-06-23 DIAGNOSIS — M62830 Muscle spasm of back: Secondary | ICD-10-CM

## 2020-06-23 DIAGNOSIS — M6281 Muscle weakness (generalized): Secondary | ICD-10-CM

## 2020-06-23 NOTE — Therapy (Signed)
[facility] [place] [facility] [address] [place], [place], [address] Phone: [phone]   Fax:  [phone]  Physical Therapy Treatment  Patient Details  Name: Thomas Savage MRN: [id number] Date of Birth: [birth year] Referring Provider (PT): Jaclynn Major Date: 06/23/2020   PT End of Session - 06/23/20 1422    Visit Number 6    Number of Visits 40    Date for PT Re-Evaluation 08/01/20    PT Start Time 1345    PT Stop Time 1425    PT Time Calculation (min) 40 min    Activity Tolerance Patient tolerated treatment well    Behavior During Therapy [facility] for tasks assessed/performed           Past Medical History:  Diagnosis Date  . Back pain     Past Surgical History:  Procedure Laterality Date  . BACK SURGERY  12/31/2016   Oblique Lateral Interbody Fusion with Allograft Dr. Alferd Patee  . LUMBAR FUSION  11/21/2016   L4-S1 Posterior Spinal Fusion   . SPINAL CORD STIMULATOR IMPLANT Left 10/14/2019   Dr. Petra Kuba  . SPINAL CORD STIMULATOR TRIAL  07/01/2019   Dr. Petra Kuba    There were no vitals filed for this visit.   Subjective Assessment - 06/23/20 1344    Subjective Back is bothering him more today, trying to get out more walking, knee is still numb, cant walk much without back hurting    Currently in Pain? Yes    Pain Location Back    Pain Orientation Mid;Lower                             [facility] Adult PT Treatment/Exercise - 06/23/20 0001      Lumbar Exercises: Aerobic   Nustep level 4 x 7 minutes      Lumbar Exercises: Standing   Shoulder Extension Strengthening;15 reps;Theraband   x2   Theraband Level (Shoulder Extension) Level 3 (Green)    Other Standing Lumbar Exercises Hip Ext 2x15      Lumbar Exercises: Seated   Other Seated Lumbar Exercises Shoulder Er green 2x10; Horiz shoulder abd red 2x10    Other Seated Lumbar Exercises green tband rows 2x15; Ab sets ball in lat 2x1      Knee/Hip  Exercises: Machines for Strengthening   Cybex Knee Extension 10lb 2x15    Cybex Knee Flexion 35lb 2x15      Knee/Hip Exercises: Standing   Hip Flexion Both;2 sets;Stengthening;Knee bent;10 reps      Knee/Hip Exercises: Seated   Long Arc Quad 2 sets;15 reps;Right    Long Arc Quad Weight 2 lbs.                    PT Symonds Term Goals - 06/07/20 0840      PT Dorval TERM GOAL #1   Title independent with intiial HEP    Status Achieved             PT Long Term Goals - 06/21/20 1433      PT LONG TERM GOAL #1   Title understand posture and body mechanics    Status On-going                 Plan - 06/23/20 1423    Clinical Impression Statement Pt reports some increase low back discomfort today. He reports being more active walking at home. Some discomfort reported on  leg curl and extension machine, but wanted to complete the intervention. Tactile cues to squeeze shoulder blades with ER and horizontal abduction. He reports that it "felt like the rod is catching" with L hip extensions.    Stability/Clinical Decision Making Evolving/Moderate complexity    Rehab Potential Good    PT Duration 12 weeks    PT Treatment/Interventions ADLs/Self Care Home Management;Cryotherapy;Moist Heat;Gait training;Functional mobility training;Therapeutic activities;Therapeutic exercise;Balance training;Neuromuscular re-education;Patient/family education;Manual techniques    PT Next Visit Plan continue to work on progression but no bending lifting and twisting           Patient will benefit from skilled therapeutic intervention in order to improve the following deficits and impairments:  Abnormal gait,Decreased range of motion,Difficulty walking,Increased muscle spasms,Decreased activity tolerance,Pain,Improper body mechanics,Impaired flexibility,Decreased balance,Decreased mobility,Decreased strength,Postural dysfunction  Visit Diagnosis: Difficulty in walking, not elsewhere  classified  Muscle weakness (generalized)  Acute bilateral low back pain with right-sided sciatica  Muscle spasm of back     Problem List Patient Active Problem List   Diagnosis Date Noted  . Subconjunctival hematoma, left 05/29/2020  . Lumbar radiculopathy 05/25/2020  . Benign essential HTN 05/25/2020  . Impaired ambulation 05/24/2020  . Right-sided low back pain with right-sided sciatica     Grayce Sessions, PTA 06/23/2020, 2:25 PM  [facility] [place] [facility] [address] [place], [place], [address] Phone: [phone]   Fax:  [phone]  Name: DAHL HIGINBOTHAM MRN: [id number] Date of Birth: 1956/06/05

## 2020-06-28 ENCOUNTER — Encounter: Payer: Self-pay | Admitting: Physical Therapy

## 2020-06-28 ENCOUNTER — Other Ambulatory Visit: Payer: Self-pay

## 2020-06-28 ENCOUNTER — Ambulatory Visit: Payer: Commercial Managed Care - PPO | Admitting: Physical Therapy

## 2020-06-28 DIAGNOSIS — M5441 Lumbago with sciatica, right side: Secondary | ICD-10-CM

## 2020-06-28 DIAGNOSIS — M6281 Muscle weakness (generalized): Secondary | ICD-10-CM

## 2020-06-28 DIAGNOSIS — R262 Difficulty in walking, not elsewhere classified: Secondary | ICD-10-CM

## 2020-06-28 NOTE — Therapy (Signed)
[facility] [place] [facility] [address] [place], [place], [address] Phone: [phone]   Fax:  [phone]  Physical Therapy Treatment  Patient Details  Name: Thomas Savage MRN: [id number] Date of Birth: 18-Jun-1956 Referring Provider (PT): Jaclynn Major Date: 06/28/2020   PT End of Session - 06/28/20 1423    Visit Number 7    Number of Visits 40    Date for PT Re-Evaluation 08/01/20    PT Start Time 1345    PT Stop Time 1425    PT Time Calculation (min) 40 min    Equipment Utilized During Treatment Back brace    Activity Tolerance Patient tolerated treatment well    Behavior During Therapy [facility] for tasks assessed/performed           Past Medical History:  Diagnosis Date  . Back pain     Past Surgical History:  Procedure Laterality Date  . BACK SURGERY  12/31/2016   Oblique Lateral Interbody Fusion with Allograft Dr. Alferd Patee  . LUMBAR FUSION  11/21/2016   L4-S1 Posterior Spinal Fusion   . SPINAL CORD STIMULATOR IMPLANT Left 10/14/2019   Dr. Petra Kuba  . SPINAL CORD STIMULATOR TRIAL  07/01/2019   Dr. Petra Kuba    There were no vitals filed for this visit.   Subjective Assessment - 06/28/20 1348    Subjective Pt reports a fall Saturday, he was walking in his house without [facility] and R knee gave out on him. No injuries reported, both knees are sore today    Currently in Pain? Yes    Pain Score 6     Pain Location --   both knees and back   Pain Orientation Left;Right                             [facility] Adult PT Treatment/Exercise - 06/28/20 0001      Lumbar Exercises: Aerobic   Nustep level 4 x 7 minutes      Lumbar Exercises: Standing   Shoulder Extension Strengthening;15 reps;Theraband   x2   Theraband Level (Shoulder Extension) Level 3 (Green)      Lumbar Exercises: Seated   Sit to Stand 5 reps   x2 without UE   Other Seated Lumbar Exercises Shoulder ER green 2x15    Other Seated Lumbar  Exercises green tband rows 2x15; Ab sets ball in lap 2x15      Knee/Hip Exercises: Standing   Other Standing Knee Exercises Alt 4in box taps with SPC 2x10 each      Knee/Hip Exercises: Seated   Long Arc Quad 2 sets;15 reps;Both;Strengthening    Long Arc Quad Weight 3 lbs.    Marching Both;2 sets;10 reps    Marching Limitations 3    Hamstring Curl Both;2 sets;15 reps;Strengthening    Hamstring Limitations green tband                    PT [name] Term Goals - 06/07/20 0840      PT [name] TERM GOAL #1   Title independent with intiial HEP    Status Achieved             PT Long Term Goals - 06/21/20 1433      PT LONG TERM GOAL #1   Title understand posture and body mechanics    Status On-going  Plan - 06/28/20 1424    Clinical Impression Statement Pt reports a fall this past Saturday due to his R knee going out. Some posterior leaning with sit to stands. RLE weakness and some lateral thigh discomfort with HS curls and LAQ. Some hesitation and instability with alt box taps, pt taps box with heel instead of fore foot. R knee weakness reported with step ups, pt reported he felt like it could go out at any time.    Stability/Clinical Decision Making Evolving/Moderate complexity    Rehab Potential Good    PT Frequency 2x / week    PT Duration 12 weeks    PT Treatment/Interventions ADLs/Self Care Home Management;Cryotherapy;Moist Heat;Gait training;Functional mobility training;Therapeutic activities;Therapeutic exercise;Balance training;Neuromuscular re-education;Patient/family education;Manual techniques    PT Next Visit Plan continue to work on progression but no bending lifting and twisting           Patient will benefit from skilled therapeutic intervention in order to improve the following deficits and impairments:  Abnormal gait,Decreased range of motion,Difficulty walking,Increased muscle spasms,Decreased activity tolerance,Pain,Improper body  mechanics,Impaired flexibility,Decreased balance,Decreased mobility,Decreased strength,Postural dysfunction  Visit Diagnosis: Difficulty in walking, not elsewhere classified  Muscle weakness (generalized)  Acute bilateral low back pain with right-sided sciatica     Problem List Patient Active Problem List   Diagnosis Date Noted  . Subconjunctival hematoma, left 05/29/2020  . Lumbar radiculopathy 05/25/2020  . Benign essential HTN 05/25/2020  . Impaired ambulation 05/24/2020  . Right-sided low back pain with right-sided sciatica     Grayce Sessions 06/28/2020, 2:30 PM  [facility] [place] [facility] [address] [place], [place], [address] Phone: [phone]   Fax:  [phone]  Name: Thomas Savage MRN: [id number] Date of Birth: 19-Jun-1956

## 2020-06-30 ENCOUNTER — Encounter: Payer: Self-pay | Admitting: Physical Therapy

## 2020-06-30 ENCOUNTER — Other Ambulatory Visit: Payer: Self-pay

## 2020-06-30 ENCOUNTER — Ambulatory Visit: Payer: Commercial Managed Care - PPO | Admitting: Physical Therapy

## 2020-06-30 DIAGNOSIS — M5441 Lumbago with sciatica, right side: Secondary | ICD-10-CM

## 2020-06-30 DIAGNOSIS — M62830 Muscle spasm of back: Secondary | ICD-10-CM

## 2020-06-30 DIAGNOSIS — M6281 Muscle weakness (generalized): Secondary | ICD-10-CM

## 2020-06-30 DIAGNOSIS — R262 Difficulty in walking, not elsewhere classified: Secondary | ICD-10-CM

## 2020-06-30 NOTE — Therapy (Signed)
[facility] [place] [facility] [address] [place], [place], [address] Phone: [phone]   Fax:  [phone]  Physical Therapy Treatment  Patient Details  Name: Thomas Savage MRN: [id number] Date of Birth: Aug 13, 1955 Referring Provider (PT): Jaclynn Major Date: 06/30/2020   PT End of Session - 06/30/20 1422    Visit Number 8    Date for PT Re-Evaluation 08/01/20    PT Start Time 1343    PT Stop Time 1423    PT Time Calculation (min) 40 min    Equipment Utilized During Treatment Back brace    Activity Tolerance Patient tolerated treatment well    Behavior During Therapy [facility] for tasks assessed/performed           Past Medical History:  Diagnosis Date  . Back pain     Past Surgical History:  Procedure Laterality Date  . BACK SURGERY  12/31/2016   Oblique Lateral Interbody Fusion with Allograft Dr. Alferd Patee  . LUMBAR FUSION  11/21/2016   L4-S1 Posterior Spinal Fusion   . SPINAL CORD STIMULATOR IMPLANT Left 10/14/2019   Dr. Petra Kuba  . SPINAL CORD STIMULATOR TRIAL  07/01/2019   Dr. Petra Kuba    There were no vitals filed for this visit.   Subjective Assessment - 06/30/20 1343    Subjective A little bit better today. R knee is numb but not really hurting    Currently in Pain? Yes    Pain Score 5     Pain Location Back                             [facility] Adult PT Treatment/Exercise - 06/30/20 0001      High Level Balance   High Level Balance Activities Side stepping    High Level Balance Comments in fron of mat table       Lumbar Exercises: Aerobic   Nustep level 4 x 7 minutes      Lumbar Exercises: Seated   Sit to Stand 5 reps   x3 no UE   Other Seated Lumbar Exercises Ab sets ball in lap 2x15      Knee/Hip Exercises: Standing   Other Standing Knee Exercises Lateral 2in box taps x10 each with SPC    Other Standing Knee Exercises Alt 6in box taps with SPC 2x10 each      Knee/Hip  Exercises: Seated   Long Arc Quad 2 sets;Both;Strengthening;10 reps    Long Arc Quad Weight 5 lbs.    Other Seated Knee/Hip Exercises Ankle pumps blue tband                    PT Bitner Term Goals - 06/07/20 0840      PT Hogsett TERM GOAL #1   Title independent with intiial HEP    Status Achieved             PT Long Term Goals - 06/21/20 1433      PT LONG TERM GOAL #1   Title understand posture and body mechanics    Status On-going                 Plan - 06/30/20 1423    Clinical Impression Statement Pt ambulated with locked R knee cause he doe not trust it. Performed more standing interventions, pt tends to balance on heels taking weight off toes. Pt reports he can not fel the bottom  of his foot Increase weight tolerated with LAQ, he was surprised in his ability to perform LAQ with height weight and minium R quad pain. Little knee flexion noted with side steps.    Stability/Clinical Decision Making Evolving/Moderate complexity    Rehab Potential Good    PT Frequency 2x / week    PT Duration 12 weeks    PT Treatment/Interventions ADLs/Self Care Home Management;Cryotherapy;Moist Heat;Gait training;Functional mobility training;Therapeutic activities;Therapeutic exercise;Balance training;Neuromuscular re-education;Patient/family education;Manual techniques    PT Next Visit Plan continue to work on progression but no bending lifting and twisting           Patient will benefit from skilled therapeutic intervention in order to improve the following deficits and impairments:  Abnormal gait,Decreased range of motion,Difficulty walking,Increased muscle spasms,Decreased activity tolerance,Pain,Improper body mechanics,Impaired flexibility,Decreased balance,Decreased mobility,Decreased strength,Postural dysfunction  Visit Diagnosis: Muscle weakness (generalized)  Acute bilateral low back pain with right-sided sciatica  Muscle spasm of back  Difficulty in walking, not  elsewhere classified     Problem List Patient Active Problem List   Diagnosis Date Noted  . Subconjunctival hematoma, left 05/29/2020  . Lumbar radiculopathy 05/25/2020  . Benign essential HTN 05/25/2020  . Impaired ambulation 05/24/2020  . Right-sided low back pain with right-sided sciatica     Grayce Sessions, PTA 06/30/2020, 2:26 PM  [facility] [place] [facility] [address] [place], [place], [address] Phone: [phone]   Fax:  [phone]  Name: Thomas Savage MRN: [id number] Date of Birth: Nov 26, 1955

## 2020-07-01 ENCOUNTER — Encounter: Payer: Self-pay | Admitting: Physical Therapy

## 2020-07-05 ENCOUNTER — Encounter: Payer: Self-pay | Admitting: Physical Therapy

## 2020-07-05 ENCOUNTER — Ambulatory Visit: Payer: Commercial Managed Care - PPO | Admitting: Physical Therapy

## 2020-07-05 ENCOUNTER — Other Ambulatory Visit: Payer: Self-pay

## 2020-07-05 DIAGNOSIS — M5441 Lumbago with sciatica, right side: Secondary | ICD-10-CM | POA: Diagnosis not present

## 2020-07-05 DIAGNOSIS — M62830 Muscle spasm of back: Secondary | ICD-10-CM

## 2020-07-05 DIAGNOSIS — R262 Difficulty in walking, not elsewhere classified: Secondary | ICD-10-CM

## 2020-07-05 DIAGNOSIS — M6281 Muscle weakness (generalized): Secondary | ICD-10-CM

## 2020-07-05 NOTE — Therapy (Signed)
[facility] [place] [facility] [address] [place], [place], [address] Phone: [phone]   Fax:  [phone]  Physical Therapy Treatment  Patient Details  Name: Thomas Savage MRN: [id number] Date of Birth: [birth year] Referring Provider (PT): Jaclynn Major Date: 07/05/2020   PT End of Session - 07/05/20 1354    Visit Number 9    Number of Visits 40    Date for PT Re-Evaluation 08/01/20    PT Start Time 1314    PT Stop Time 1357    PT Time Calculation (min) 43 min    Equipment Utilized During Treatment Back brace    Activity Tolerance Patient tolerated treatment well    Behavior During Therapy [facility] for tasks assessed/performed           Past Medical History:  Diagnosis Date  . Back pain     Past Surgical History:  Procedure Laterality Date  . BACK SURGERY  12/31/2016   Oblique Lateral Interbody Fusion with Allograft Dr. Alferd Patee  . LUMBAR FUSION  11/21/2016   L4-S1 Posterior Spinal Fusion   . SPINAL CORD STIMULATOR IMPLANT Left 10/14/2019   Dr. Petra Kuba  . SPINAL CORD STIMULATOR TRIAL  07/01/2019   Dr. Petra Kuba    There were no vitals filed for this visit.   Subjective Assessment - 07/05/20 1318    Subjective Patient reports that he has had significant cramps in the LE's after the last visit, he did more standing activities.  Reports that this has really subsided.  He reports concerns about right knee numbness and back pain, reports that the back feels like "catching"    Currently in Pain? Yes    Pain Score 6     Pain Location Back    Aggravating Factors  walking and standing                             [facility] Adult PT Treatment/Exercise - 07/05/20 0001      Lumbar Exercises: Stretches   Passive Hamstring Stretch Right;5 reps;10 seconds    Passive Hamstring Stretch Limitations seated    Piriformis Stretch Right;Left;5 reps;10 seconds    Piriformis Stretch Limitations seated slef pulling       Lumbar Exercises: Aerobic   Nustep level 4 x 7 minutes      Lumbar Exercises: Machines for Strengthening   Cybex Knee Extension 5# 2x10    Cybex Knee Flexion 25# 2x10    Leg Press 20# x10 both legs, no weight both legs x10, right only no wieght x 10    Other Lumbar Machine Exercise standing 5# pulleys row 2x10, then straight arm pull with limited motion x 10      Lumbar Exercises: Seated   Other Seated Lumbar Exercises 3# weighted barr overhead reach and then behind back extnesion    Other Seated Lumbar Exercises Ab sets ball in lap 2x15, red tband AR press      Knee/Hip Exercises: Seated   Ball Squeeze 2x10 3 sec hold     Other Seated Knee/Hip Exercises red tband PF/DF and eversion 2x10 each    Other Seated Knee/Hip Exercises red tband hip abduction in sitting                    PT Laubacher Term Goals - 06/07/20 0840      PT Rasnic TERM GOAL #1   Title independent with intiial HEP  Status Achieved             PT Long Term Goals - 06/21/20 1433      PT LONG TERM GOAL #1   Title understand posture and body mechanics    Status On-going                 Plan - 07/05/20 1355    Clinical Impression Statement Patient reported increase leg cramps after last session, he had done a lot more standing with [place].  I decresaed the standing but added some increased LE flexibility and AR press, LE flexion and extension and leg press.  He had some knee issues but not bad with these.    PT Next Visit Plan continue to work on progression but no bending lifting and twisting    Consulted and Agree with Plan of Care Patient           Patient will benefit from skilled therapeutic intervention in order to improve the following deficits and impairments:  Abnormal gait,Decreased range of motion,Difficulty walking,Increased muscle spasms,Decreased activity tolerance,Pain,Improper body mechanics,Impaired flexibility,Decreased balance,Decreased mobility,Decreased strength,Postural  dysfunction  Visit Diagnosis: Muscle weakness (generalized)  Acute bilateral low back pain with right-sided sciatica  Muscle spasm of back  Difficulty in walking, not elsewhere classified     Problem List Patient Active Problem List   Diagnosis Date Noted  . Subconjunctival hematoma, left 05/29/2020  . Lumbar radiculopathy 05/25/2020  . Benign essential HTN 05/25/2020  . Impaired ambulation 05/24/2020  . Right-sided low back pain with right-sided sciatica     [name], PT 07/05/2020, 1:58 PM  [facility] [place] [facility] [address] [place], [place], [address] Phone: [phone]   Fax:  [phone]  Name: Thomas Savage MRN: [id number] Date of Birth: 11-06-1955

## 2020-07-07 ENCOUNTER — Ambulatory Visit: Payer: Commercial Managed Care - PPO | Admitting: Physical Therapy

## 2020-07-07 ENCOUNTER — Other Ambulatory Visit: Payer: Self-pay

## 2020-07-07 ENCOUNTER — Encounter: Payer: Self-pay | Admitting: Physical Therapy

## 2020-07-07 DIAGNOSIS — M6281 Muscle weakness (generalized): Secondary | ICD-10-CM

## 2020-07-07 DIAGNOSIS — R262 Difficulty in walking, not elsewhere classified: Secondary | ICD-10-CM

## 2020-07-07 DIAGNOSIS — M5441 Lumbago with sciatica, right side: Secondary | ICD-10-CM | POA: Diagnosis not present

## 2020-07-07 DIAGNOSIS — M62830 Muscle spasm of back: Secondary | ICD-10-CM

## 2020-07-07 NOTE — Therapy (Signed)
[place]. [place], [place], [address] Phone: [phone]   Fax:  [phone]  Physical Therapy Treatment  Patient Details  Name: Thomas [place] MRN: [id number] Date of Birth: [birth year] Referring Provider (PT): Macarthur Critchley Date: 07/07/2020   PT End of Session - 07/07/20 1346    Visit Number 10    Number of Visits 40    Date for PT Re-Evaluation 08/01/20    PT Start Time 1304    PT Stop Time 1352    PT Time Calculation (min) 48 min    Activity Tolerance Patient tolerated treatment well    Behavior During Therapy [facility] for tasks assessed/performed           Past Medical History:  Diagnosis Date  . Back pain     Past Surgical History:  Procedure Laterality Date  . BACK SURGERY  12/31/2016   Oblique Lateral Interbody Fusion with Allograft Dr. Jethro Poling  . LUMBAR FUSION  11/21/2016   L4-S1 Posterior Spinal Fusion   . SPINAL CORD STIMULATOR IMPLANT Left 10/14/2019   Dr. Sheliah Mends  . SPINAL CORD STIMULATOR TRIAL  07/01/2019   Dr. Sheliah Mends    There were no vitals filed for this visit.   Subjective Assessment - 07/07/20 1308    Subjective Patient reports that he still had cramps, less but still cramps, reports that he does not trust the knee    Currently in Pain? Yes    Pain Score 6     Pain Location Back    Pain Orientation Lower    Pain Descriptors / Indicators Aching                             [facility] Adult PT Treatment/Exercise - 07/07/20 0001      Lumbar Exercises: Stretches   Passive Hamstring Stretch Right;Left;5 reps;20 seconds    Passive Hamstring Stretch Limitations supine    Piriformis Stretch Right;Left;5 reps;20 seconds    Piriformis Stretch Limitations supine    Gastroc Stretch Right;Left;5 reps;10 seconds    Gastroc Stretch Limitations supine    Other Lumbar Stretch Exercise qaud stretch upine      Lumbar Exercises: Aerobic   Nustep level 4 x 7  minutes      Lumbar Exercises: Machines for Strengthening   Cybex Knee Extension 5# 2x10, then right leg only 1x5    Cybex Knee Flexion 25# 2x10    Leg Press 20# x10 both legs, no weight both legs x10, right only no wieght x 10    Other Lumbar Machine Exercise standing 5# pulleys row 2x10, then straight arm pull with limited motion x 10      Knee/Hip Exercises: Seated   Other Seated Knee/Hip Exercises red tband PF/DF and eversion 2x10 each      Modalities   Modalities Moist Heat      Moist Heat Therapy   Number Minutes Moist Heat 12 Minutes    Moist Heat Location Lumbar Spine   while manually stretcheong                   PT [place] Term Goals - 06/07/20 0840      PT [place] TERM GOAL #1   Title independent with intiial HEP    Status Achieved             PT Long Term Goals - 07/07/20 1348  PT LONG TERM GOAL #1   Title understand posture and body mechanics    Status Partially Met      PT LONG TERM GOAL #2   Title decrease back pain overall 50%    Status On-going      PT LONG TERM GOAL #3   Title increase right knee extension to 5 degrees from full extension    Status Partially Met      PT LONG TERM GOAL #4   Title walk with SPC or less restrictive device x 500 feet    Status On-going                 Plan - 07/07/20 1347    Clinical Impression Statement I continued with the LE strength some cues to control the right knee extension and not let it pop back, focus today with him supine on heat really stretching bilateral LE some nerve flossing.    PT Next Visit Plan see how today's treatment did    Consulted and Agree with Plan of Care Patient           Patient will benefit from skilled therapeutic intervention in order to improve the following deficits and impairments:  Abnormal gait,Decreased range of motion,Difficulty walking,Increased muscle spasms,Decreased activity tolerance,Pain,Improper body mechanics,Impaired flexibility,Decreased  balance,Decreased mobility,Decreased strength,Postural dysfunction  Visit Diagnosis: Muscle weakness (generalized)  Acute bilateral low back pain with right-sided sciatica  Muscle spasm of back  Difficulty in walking, not elsewhere classified     Problem List Patient Active Problem List   Diagnosis Date Noted  . Subconjunctival hematoma, left 05/29/2020  . Lumbar radiculopathy 05/25/2020  . Benign essential HTN 05/25/2020  . Impaired ambulation 05/24/2020  . Right-sided low back pain with right-sided sciatica     Sumner Boast., PT 07/07/2020, 1:49 PM  [place]. [place], [place], [address] Phone: [phone]   Fax:  [phone]  Name: Thomas [place] MRN: [id number] Date of Birth: 1955-11-04

## 2020-07-12 ENCOUNTER — Ambulatory Visit: Payer: Commercial Managed Care - PPO | Admitting: Physical Therapy

## 2020-07-12 ENCOUNTER — Other Ambulatory Visit: Payer: Self-pay

## 2020-07-12 ENCOUNTER — Encounter: Payer: Self-pay | Admitting: Physical Therapy

## 2020-07-12 DIAGNOSIS — R262 Difficulty in walking, not elsewhere classified: Secondary | ICD-10-CM

## 2020-07-12 DIAGNOSIS — M6281 Muscle weakness (generalized): Secondary | ICD-10-CM

## 2020-07-12 DIAGNOSIS — M5441 Lumbago with sciatica, right side: Secondary | ICD-10-CM | POA: Diagnosis not present

## 2020-07-12 DIAGNOSIS — M62830 Muscle spasm of back: Secondary | ICD-10-CM

## 2020-07-12 DIAGNOSIS — H1132 Conjunctival hemorrhage, left eye: Secondary | ICD-10-CM

## 2020-07-12 NOTE — Therapy (Signed)
[place]. [place], [place], [address] Phone: [phone]   Fax:  [phone]  Physical Therapy Treatment  Patient Details  Name: Thomas [place] MRN: [id number] Date of Birth: [birth year] Referring Provider (PT): Macarthur Critchley Date: 07/12/2020   PT End of Session - 07/12/20 1419    Visit Number 11    Date for PT Re-Evaluation 08/01/20    PT Start Time 1345    PT Stop Time 1400    PT Time Calculation (min) 15 min    Equipment Utilized During Treatment Back brace    Activity Tolerance Patient tolerated treatment well    Behavior During Therapy [facility] for tasks assessed/performed           Past Medical History:  Diagnosis Date  . Back pain     Past Surgical History:  Procedure Laterality Date  . BACK SURGERY  12/31/2016   Oblique Lateral Interbody Fusion with Allograft Dr. Jethro Poling  . LUMBAR FUSION  11/21/2016   L4-S1 Posterior Spinal Fusion   . SPINAL CORD STIMULATOR IMPLANT Left 10/14/2019   Dr. Sheliah Mends  . SPINAL CORD STIMULATOR TRIAL  07/01/2019   Dr. Sheliah Mends    There were no vitals filed for this visit.   Subjective Assessment - 07/12/20 1342    Subjective Get gel injections in both knees tomorrow, Tightness and pain in R hip, back is about the same.    Currently in Pain? Yes    Pain Score 6                              [facility] Adult PT Treatment/Exercise - 07/12/20 0001      Lumbar Exercises: Stretches   Piriformis Stretch Right;Left;5 reps;20 seconds    Piriformis Stretch Limitations supine    Gastroc Stretch Right;Left;5 reps;10 seconds    Gastroc Stretch Limitations supine    Other Lumbar Stretch Exercise qaud stretch upine      Lumbar Exercises: Aerobic   Nustep level 4 x 7 minutes      Lumbar Exercises: Machines for Strengthening   Cybex Knee Extension right leg only 3x5    Cybex Knee Flexion 25# 2x15    Leg Press 20# 2x10 both legs, Right only no  wieght x 10    Other Lumbar Machine Exercise standing 5# pulleys row 2x10      Knee/Hip Exercises: Seated   Other Seated Knee/Hip Exercises RLE fitter press 2 blue 2x15      Modalities   Modalities Moist Heat      Moist Heat Therapy   Number Minutes Moist Heat 12 Minutes    Moist Heat Location Lumbar Spine   while manual stretching                   PT [place] Term Goals - 06/07/20 0840      PT [place] TERM GOAL #1   Title independent with intiial HEP    Status Achieved             PT Long Term Goals - 07/07/20 1348      PT LONG TERM GOAL #1   Title understand posture and body mechanics    Status Partially Met      PT LONG TERM GOAL #2   Title decrease back pain overall 50%    Status On-going      PT LONG TERM GOAL #3  Title increase right knee extension to 5 degrees from full extension    Status Partially Met      PT LONG TERM GOAL #4   Title walk with SPC or less restrictive device x 500 feet    Status On-going                 Plan - 07/12/20 1421    Clinical Impression Statement Continues with nerve flossing and stretching in the LE with heat ln Low back. He voices concern about his RLE weakness. Attempted to do a 4 inch step up with LLE bit was too difficult due to LLE weakness. RLE fatigue reported with fitter presses.    Stability/Clinical Decision Making Evolving/Moderate complexity    Rehab Potential Good    PT Frequency 2x / week    PT Duration 12 weeks    PT Treatment/Interventions ADLs/Self Care Home Management;Cryotherapy;Moist Heat;Gait training;Functional mobility training;Therapeutic activities;Therapeutic exercise;Balance training;Neuromuscular re-education;Patient/family education;Manual techniques    PT Next Visit Plan assess Tx progress as tlerated           Patient will benefit from skilled therapeutic intervention in order to improve the following deficits and impairments:  Abnormal gait,Decreased range of motion,Difficulty  walking,Increased muscle spasms,Decreased activity tolerance,Pain,Improper body mechanics,Impaired flexibility,Decreased balance,Decreased mobility,Decreased strength,Postural dysfunction  Visit Diagnosis: Muscle weakness (generalized)  Acute bilateral low back pain with right-sided sciatica  Subconjunctival hematoma, left  Muscle spasm of back  Difficulty in walking, not elsewhere classified     Problem List Patient Active Problem List   Diagnosis Date Noted  . Subconjunctival hematoma, left 05/29/2020  . Lumbar radiculopathy 05/25/2020  . Benign essential HTN 05/25/2020  . Impaired ambulation 05/24/2020  . Right-sided low back pain with right-sided sciatica     Scot Jun, PTA 07/12/2020, 2:27 PM  [place]. [place], [place], [address] Phone: [phone]   Fax:  [phone]  Name: Thomas [place] MRN: [id number] Date of Birth: 08-Aug-1955

## 2020-07-14 ENCOUNTER — Ambulatory Visit: Payer: Commercial Managed Care - PPO | Admitting: Physical Therapy

## 2020-07-14 ENCOUNTER — Encounter: Payer: Self-pay | Admitting: Physical Therapy

## 2020-07-14 ENCOUNTER — Other Ambulatory Visit: Payer: Self-pay

## 2020-07-14 DIAGNOSIS — M5441 Lumbago with sciatica, right side: Secondary | ICD-10-CM

## 2020-07-14 DIAGNOSIS — M6281 Muscle weakness (generalized): Secondary | ICD-10-CM

## 2020-07-14 NOTE — Therapy (Signed)
[place]. [place], [place], [address] Phone: [phone]   Fax:  [phone]  Physical Therapy Treatment  Patient Details  Name: Thomas [place] MRN: [id number] Date of Birth: 1955/08/09 Referring Provider (PT): Macarthur Critchley Date: 07/14/2020   PT End of Session - 07/14/20 1425    Visit Number 12    Number of Visits 40    Date for PT Re-Evaluation 08/01/20    PT Start Time 9323    PT Stop Time 1430    PT Time Calculation (min) 45 min    Activity Tolerance Patient tolerated treatment well    Behavior During Therapy [facility] for tasks assessed/performed           Past Medical History:  Diagnosis Date  . Back pain     Past Surgical History:  Procedure Laterality Date  . BACK SURGERY  12/31/2016   Oblique Lateral Interbody Fusion with Allograft Dr. Jethro Poling  . LUMBAR FUSION  11/21/2016   L4-S1 Posterior Spinal Fusion   . SPINAL CORD STIMULATOR IMPLANT Left 10/14/2019   Dr. Sheliah Mends  . SPINAL CORD STIMULATOR TRIAL  07/01/2019   Dr. Sheliah Mends    There were no vitals filed for this visit.   Subjective Assessment - 07/14/20 1348    Subjective Received gel shots in both knee yesterday. Today knees are sore    Currently in Pain? Yes    Pain Score 6    L knee 5/10   Pain Location Back                             [facility] Adult PT Treatment/Exercise - 07/14/20 0001      Lumbar Exercises: Stretches   Passive Hamstring Stretch Right;Left;5 reps;20 seconds    Passive Hamstring Stretch Limitations supine    Piriformis Stretch Right;Left;5 reps;20 seconds    Piriformis Stretch Limitations supine    Gastroc Stretch Right;Left;5 reps;10 seconds    Gastroc Stretch Limitations supine    Other Lumbar Stretch Exercise qaud stretch supine, nerve flossing      Lumbar Exercises: Aerobic   Nustep level 4 x 7 minutes      Lumbar Exercises: Machines for Strengthening   Leg Press 20# 2x15  both legs, Right only no wieght x 10    Other Lumbar Machine Exercise Straight arm pull downs 10lb, Rev grip row 15lb 2x10      Knee/Hip Exercises: Seated   Long Arc Quad 2 sets;Both;10 reps;AROM    Hamstring Curl Both;2 sets;15 reps;Strengthening    Hamstring Limitations green tband      Modalities   Modalities Moist Heat      Moist Heat Therapy   Number Minutes Moist Heat 12 Minutes    Moist Heat Location Lumbar Spine   while stretching                   PT [place] Term Goals - 06/07/20 0840      PT [place] TERM GOAL #1   Title independent with intiial HEP    Status Achieved             PT Long Term Goals - 07/07/20 1348      PT LONG TERM GOAL #1   Title understand posture and body mechanics    Status Partially Met      PT LONG TERM GOAL #2   Title decrease back pain overall  50%    Status On-going      PT LONG TERM GOAL #3   Title increase right knee extension to 5 degrees from full extension    Status Partially Met      PT LONG TERM GOAL #4   Title walk with SPC or less restrictive device x 500 feet    Status On-going                 Plan - 07/14/20 1426    Clinical Impression Statement Gel shots in knees received yesterday so  a lower intense session chosen. Some increase knee soreness reported after LAQ and HS curls. Pt elected to 5 additional reps per set with leg press. Tactile cues for erect posture with standing reverse grip rows. Positive response to nerve flossing.    Stability/Clinical Decision Making Evolving/Moderate complexity    Rehab Potential Good    PT Frequency 2x / week    PT Treatment/Interventions ADLs/Self Care Home Management;Cryotherapy;Moist Heat;Gait training;Functional mobility training;Therapeutic activities;Therapeutic exercise;Balance training;Neuromuscular re-education;Patient/family education;Manual techniques    PT Next Visit Plan assess Tx progress as tolerated           Patient will benefit from skilled  therapeutic intervention in order to improve the following deficits and impairments:  Abnormal gait,Decreased range of motion,Difficulty walking,Increased muscle spasms,Decreased activity tolerance,Pain,Improper body mechanics,Impaired flexibility,Decreased balance,Decreased mobility,Decreased strength,Postural dysfunction  Visit Diagnosis: Muscle weakness (generalized)  Acute bilateral low back pain with right-sided sciatica     Problem List Patient Active Problem List   Diagnosis Date Noted  . Subconjunctival hematoma, left 05/29/2020  . Lumbar radiculopathy 05/25/2020  . Benign essential HTN 05/25/2020  . Impaired ambulation 05/24/2020  . Right-sided low back pain with right-sided sciatica     Scot Jun, PTA 07/14/2020, 2:28 PM  [place]. [place], [place], [address] Phone: [phone]   Fax:  [phone]  Name: Thomas [place] MRN: [id number] Date of Birth: Nov 22, 1955

## 2020-07-19 ENCOUNTER — Encounter: Payer: Self-pay | Admitting: Physical Therapy

## 2020-07-19 ENCOUNTER — Ambulatory Visit: Payer: Medicare Other | Attending: Neurosurgery | Admitting: Physical Therapy

## 2020-07-19 ENCOUNTER — Other Ambulatory Visit: Payer: Self-pay

## 2020-07-19 DIAGNOSIS — M5441 Lumbago with sciatica, right side: Secondary | ICD-10-CM | POA: Insufficient documentation

## 2020-07-19 DIAGNOSIS — R262 Difficulty in walking, not elsewhere classified: Secondary | ICD-10-CM | POA: Diagnosis present

## 2020-07-19 DIAGNOSIS — M6281 Muscle weakness (generalized): Secondary | ICD-10-CM | POA: Diagnosis present

## 2020-07-19 DIAGNOSIS — M62830 Muscle spasm of back: Secondary | ICD-10-CM | POA: Insufficient documentation

## 2020-07-19 NOTE — Therapy (Signed)
[place]. [place], [place], [address] Phone: [phone]   Fax:  [phone]  Physical Therapy Treatment  Patient Details  Name: Thomas [place] MRN: [id number] Date of Birth: 1955/11/13 Referring Provider (PT): Macarthur Critchley Date: 07/19/2020   PT End of Session - 07/19/20 1014    Visit Number 13    Number of Visits 40    Date for PT Re-Evaluation 08/01/20    PT Start Time 0928    PT Stop Time 1026    PT Time Calculation (min) 58 min    Activity Tolerance Patient limited by pain    Behavior During Therapy [facility] for tasks assessed/performed           Past Medical History:  Diagnosis Date  . Back pain     Past Surgical History:  Procedure Laterality Date  . BACK SURGERY  12/31/2016   Oblique Lateral Interbody Fusion with Allograft Dr. Jethro Poling  . LUMBAR FUSION  11/21/2016   L4-S1 Posterior Spinal Fusion   . SPINAL CORD STIMULATOR IMPLANT Left 10/14/2019   Dr. Sheliah Mends  . SPINAL CORD STIMULATOR TRIAL  07/01/2019   Dr. Sheliah Mends    There were no vitals filed for this visit.   Subjective Assessment - 07/19/20 0936    Subjective Knees still feel tight.  Reports that he is feeling more pain in the right anterior hip with lifting the right leg up and clearing the bath tub    Currently in Pain? Yes    Pain Score 6     Pain Location Back    Pain Orientation Lower    Pain Descriptors / Indicators Sore    Aggravating Factors  walking makes me feel a grabbing in my back                             [facility] Adult PT Treatment/Exercise - 07/19/20 0001      Lumbar Exercises: Stretches   Passive Hamstring Stretch Right;Left;5 reps;20 seconds    Piriformis Stretch Right;Left;5 reps;20 seconds    Gastroc Stretch 3 reps;20 seconds    Gastroc Stretch Limitations standing    Other Lumbar Stretch Exercise qaud stretch supine, nerve flossing      Lumbar Exercises: Aerobic   Tread  Mill 1.0 mph x 2 minutes    Recumbent Bike level 2.5 x4 minutes      Lumbar Exercises: Machines for Strengthening   Leg Press 20# 2x15 both legs, Right only no wieght x 10, 20# right only  small motions with green tband for TKE and coordination    Other Lumbar Machine Exercise 35# triceps 2x15, biceps 10#    Other Lumbar Machine Exercise Straight arm pull downs 10lb, Rev grip row 15lb 2x10      Modalities   Modalities Electrical Stimulation      Moist Heat Therapy   Number Minutes Moist Heat 12 Minutes    Moist Heat Location Lumbar Spine      Electrical Stimulation   Electrical Stimulation Location L/S area    Electrical Stimulation Action IFC    Electrical Stimulation Parameters supine    Electrical Stimulation Goals Pain                    PT [place] Term Goals - 06/07/20 0840      PT [place] TERM GOAL #1   Title independent with intiial HEP  Status Achieved             PT Long Term Goals - 07/19/20 1019      PT LONG TERM GOAL #1   Title understand posture and body mechanics    Status Partially Met      PT LONG TERM GOAL #2   Title decrease back pain overall 50%    Status On-going      PT LONG TERM GOAL #3   Title increase right knee extension to 5 degrees from full extension      PT LONG TERM GOAL #4   Title walk with SPC or less restrictive device x 500 feet    Status On-going                 Plan - 07/19/20 1015    Clinical Impression Statement Patient seems to have some decreased coordinationproprioception iwth the right knee, he is afraid of it giving out and snapping back, I worked with him some on this to see if we could get better control and not snap back, seems to work with the green tband but this did fatigue him, He continues to have pain in the piriformis and the right anterior hip area.  He struggles with walking due to pain and a grabbing in the back.  Over the past month he is doing much better with his ablility to walk and move  the right leg, but still not walking very well    PT Next Visit Plan he will see the MD tomorrow, will need to write renewal, please advise if we need to change anything, will continue to work on strength and function    Consulted and Agree with Plan of Care Patient           Patient will benefit from skilled therapeutic intervention in order to improve the following deficits and impairments:  Abnormal gait,Decreased range of motion,Difficulty walking,Increased muscle spasms,Decreased activity tolerance,Pain,Improper body mechanics,Impaired flexibility,Decreased balance,Decreased mobility,Decreased strength,Postural dysfunction  Visit Diagnosis: Muscle weakness (generalized)  Acute bilateral low back pain with right-sided sciatica  Muscle spasm of back  Difficulty in walking, not elsewhere classified     Problem List Patient Active Problem List   Diagnosis Date Noted  . Subconjunctival hematoma, left 05/29/2020  . Lumbar radiculopathy 05/25/2020  . Benign essential HTN 05/25/2020  . Impaired ambulation 05/24/2020  . Right-sided low back pain with right-sided sciatica     Sumner Boast., PT 07/19/2020, 11:47 AM  [place]. [place], [place], [address] Phone: [phone]   Fax:  [phone]  Name: MALIKAI GUT MRN: [id number] Date of Birth: 1956-01-01

## 2020-07-21 ENCOUNTER — Ambulatory Visit: Payer: Medicare Other | Admitting: Physical Therapy

## 2020-07-25 ENCOUNTER — Other Ambulatory Visit: Payer: Self-pay | Admitting: Neurosurgery

## 2020-07-25 ENCOUNTER — Telehealth: Payer: Self-pay

## 2020-07-25 DIAGNOSIS — M5416 Radiculopathy, lumbar region: Secondary | ICD-10-CM

## 2020-07-25 NOTE — Telephone Encounter (Signed)
Phone call to patient to verify medication list and allergies for myelogram procedure. Pt instructed to hold Tramadol for 48hrs prior to myelogram appointment time and 24 hours after appointment. Pt also instructed to have a driver the day of the procedure, the procedure would take around 2 hours, and discharge instructions discussed. Pt verbalized understanding.  

## 2020-07-26 ENCOUNTER — Ambulatory Visit: Payer: Medicare Other | Admitting: Physical Therapy

## 2020-07-27 ENCOUNTER — Ambulatory Visit
Admission: RE | Admit: 2020-07-27 | Discharge: 2020-07-27 | Disposition: A | Discharge status: Home / Self Care | Payer: Medicare Other | Source: Ambulatory Visit | Attending: Neurosurgery | Admitting: Neurosurgery

## 2020-07-27 ENCOUNTER — Other Ambulatory Visit: Payer: Self-pay

## 2020-07-27 DIAGNOSIS — M5416 Radiculopathy, lumbar region: Secondary | ICD-10-CM

## 2020-07-27 MED ORDER — MEPERIDINE HCL 50 MG/ML IJ SOLN
50.0000 mg | Freq: Once | INTRAMUSCULAR | Status: AC
  Administered 2020-07-27: 50 mg via INTRAMUSCULAR

## 2020-07-27 MED ORDER — DIAZEPAM 5 MG PO TABS
10.0000 mg | ORAL_TABLET | Freq: Once | ORAL | Status: AC
  Administered 2020-07-27: 10 mg via ORAL

## 2020-07-27 MED ORDER — ONDANSETRON HCL 4 MG/2ML IJ SOLN
4.0000 mg | Freq: Once | INTRAMUSCULAR | Status: AC
  Administered 2020-07-27: 4 mg via INTRAMUSCULAR

## 2020-07-27 MED ORDER — IOPAMIDOL (ISOVUE-M 200) INJECTION 41%
20.0000 mL | Freq: Once | INTRAMUSCULAR | Status: AC
  Administered 2020-07-27: 20 mL via INTRATHECAL

## 2020-07-27 NOTE — Progress Notes (Signed)
Pt reports he has been off of his Tramadol for at least 48 hours.  

## 2020-07-27 NOTE — Discharge Instr - Other Orders (Signed)
Dr. Deanne Coffer requested to give the patient medication prior to him beginning the  Myelogram procedure to make the patient comfortable on the table. See MAR.

## 2020-07-27 NOTE — Discharge Instructions (Signed)
Myelogram Discharge Instructions  1. Go home and rest quietly for the next 24 hours.  It is important to lie flat for the next 24 hours.  Get up only to go to the restroom.  You may lie in the bed or on a couch on your back, your stomach, your left side or your right side.  You may have one pillow under your head.  You may have pillows between your knees while you are on your side or under your knees while you are on your back.  2. DO NOT drive today.  Recline the seat as far back as it will go, while still wearing your seat belt, on the way home.  3. You may get up to go to the bathroom as needed.  You may sit up for 10 minutes to eat.  You may resume your normal diet and medications unless otherwise indicated.  Drink lots of extra fluids today and tomorrow.  4. The incidence of headache, nausea, or vomiting is about 5% (one in 20 patients).  If you develop a headache, lie flat and drink plenty of fluids until the headache goes away.  Caffeinated beverages may be helpful.  If you develop severe nausea and vomiting or a headache that does not go away with flat bed rest, call [phone].  5. You may resume normal activities after your 24 hours of bed rest is over; however, do not exert yourself strongly or do any heavy lifting tomorrow. If when you get up you have a headache when standing, go back to bed and force fluids for another 24 hours.  6. Call your physician for a follow-up appointment.  The results of your myelogram will be sent directly to your physician by the following day.  7. If you have any questions or if complications develop after you arrive home, please call [phone].  Discharge instructions have been explained to the patient.  The patient, or the person responsible for the patient, fully understands these instructions  YOU MAY TAKE YOUR TRAMADOL TOMORROW ON 07/28/20 @ 9:30AM OR THEREAFTER.

## 2020-07-28 ENCOUNTER — Ambulatory Visit: Payer: Medicare Other | Admitting: Physical Therapy

## 2020-08-02 ENCOUNTER — Ambulatory Visit: Payer: Medicare Other | Admitting: Physical Therapy

## 2020-08-04 ENCOUNTER — Other Ambulatory Visit: Payer: Self-pay | Admitting: Neurosurgery

## 2020-08-04 ENCOUNTER — Ambulatory Visit: Payer: Medicare Other | Admitting: Physical Therapy

## 2020-08-08 ENCOUNTER — Other Ambulatory Visit (HOSPITAL_COMMUNITY)
Admission: RE | Admit: 2020-08-08 | Discharge: 2020-08-08 | Disposition: A | Discharge status: Home / Self Care | Payer: Medicare Other | Source: Ambulatory Visit | Attending: Neurosurgery | Admitting: Neurosurgery

## 2020-08-08 ENCOUNTER — Other Ambulatory Visit: Payer: Self-pay

## 2020-08-08 ENCOUNTER — Encounter (HOSPITAL_COMMUNITY): Payer: Self-pay | Admitting: Neurosurgery

## 2020-08-08 DIAGNOSIS — Z01812 Encounter for preprocedural laboratory examination: Secondary | ICD-10-CM | POA: Insufficient documentation

## 2020-08-08 DIAGNOSIS — Z20822 Contact with and (suspected) exposure to covid-19: Secondary | ICD-10-CM | POA: Insufficient documentation

## 2020-08-08 LAB — SARS CORONAVIRUS 2 (TAT 6-24 HRS): SARS Coronavirus 2: NEGATIVE

## 2020-08-08 MED ORDER — DEXTROSE 5 % IV SOLN
3.0000 g | INTRAVENOUS | Status: DC
  Filled 2020-08-08: qty 3000

## 2020-08-08 NOTE — Anesthesia Preprocedure Evaluation (Addendum)
Anesthesia Evaluation  Patient identified by MRN, date of birth, ID band Patient awake    Reviewed: Allergy & Precautions, NPO status , Patient's Chart, lab work & pertinent test results  Airway Mallampati: III  TM Distance: >3 FB Neck ROM: Full    Dental no notable dental hx.    Pulmonary former smoker,    Pulmonary exam normal breath sounds clear to auscultation       Cardiovascular hypertension, Pt. on medications Normal cardiovascular exam Rhythm:Regular Rate:Normal  ECG: rate 79. Normal sinus rhythm Left axis deviation Right bundle branch block   Neuro/Psych  Neuromuscular disease negative psych ROS   GI/Hepatic negative GI ROS, Neg liver ROS,   Endo/Other  negative endocrine ROS  Renal/GU negative Renal ROS     Musculoskeletal  (+) Arthritis , Back pain   Abdominal (+) + obese,   Peds  Hematology negative hematology ROS (+)   Anesthesia Other Findings Lumbar pseudoarthrosis  Reproductive/Obstetrics                           Anesthesia Physical Anesthesia Plan  ASA: II  Anesthesia Plan: General   Post-op Pain Management:    Induction: Intravenous  PONV Risk Score and Plan: 2 and Ondansetron, Dexamethasone, Treatment may vary due to age or medical condition and Midazolam  Airway Management Planned: Oral ETT  Additional Equipment:   Intra-op Plan:   Post-operative Plan: Extubation in OR  Informed Consent: I have reviewed the patients History and Physical, chart, labs and discussed the procedure including the risks, benefits and alternatives for the proposed anesthesia with the patient or authorized representative who has indicated his/her understanding and acceptance.     Dental advisory given  Plan Discussed with: CRNA  Anesthesia Plan Comments: (Potential arterial line discussed Reviewed PAT note by Antionette Poles, PA-C: History of atrial fibrillation status post  successful ablation 09/30/2017 at [facility] in [place].  Last seen by cardiology 11/18/2018, at that time a 14-day event monitor was ordered which showed patient was maintaining sinus rhythm. Recently underwent revision L2-4 PLIF 05/25/2020 without complication.  Presence of spinal cord stimulator. He will need day of surgery labs and evaluation. 14-day event monitor 12/29/2018 (Care Everywhere): Sinus rhythm with occasional NSAT some with aberrancy  TTE 06/04/2017 (Care Everywhere): Conclusions  Summary  Normal left ventricular size and systolic function with no appreciable  segmental abnormality.  Ejection fraction is visually estimated at 55-60%  Mild mitral regurgitation  Nuclear stress 06/27/2012 (Care Everywhere):  EKG Interpretation   : Negative for ST segment changes with an adequate heart rate  Additional Comments  : POST STRESS LVEF BY QGS = 59% Normal wall motion.  )      Anesthesia Quick Evaluation

## 2020-08-08 NOTE — Progress Notes (Signed)
PCP - Dr Herma Carson Cardiologist - Orion Modest, NP Neurology - Dr Noreene Filbert Pain Mgmt - Dr Petra Kuba  Chest x-ray - n/a EKG - DOS 08/09/20 Stress Test - n/a ECHO - n/a Cardiac Cath - n/a  Anesthesia review: Yes  STOP now taking any Aspirin (unless otherwise instructed by your surgeon), Aleve, Naproxen, Ibuprofen, Motrin, Advil, Goody's, BC's, all herbal medications, fish oil, and all vitamins.   Coronavirus Screening Covid test is scheduled on  Do you have any of the following symptoms:  Cough yes/no: No Fever (>100.44F)  yes/no: No Runny nose yes/no: No Sore throat yes/no: No Difficulty breathing/shortness of breath  yes/no: No  Have you traveled in the last 14 days and where? yes/no: No  Patient verbalized understanding of instructions that were given via phone.

## 2020-08-08 NOTE — Progress Notes (Signed)
Anesthesia Chart Review: Same-day work-up  History of atrial fibrillation status post successful ablation 09/30/2017 at [facility] in [place].  Last seen by cardiology 11/18/2018, at that time a 14-day event monitor was ordered which showed patient was maintaining sinus rhythm.  Recently underwent revision L2-4 PLIF 05/25/2020 without complication.  Presence of spinal cord stimulator.  He will need day of surgery labs and evaluation.  14-day event monitor 12/29/2018 (Care Everywhere): Sinus rhythm with occasional NSAT some with aberrancy   TTE 06/04/2017 (Care Everywhere): Conclusions  Summary  Normal left ventricular size and systolic function with no appreciable  segmental abnormality.  Ejection fraction is visually estimated at 55-60%  Mild mitral regurgitation   Nuclear stress 06/27/2012 (Care Everywhere):  EKG Interpretation   : Negative for ST segment changes with an adequate heart rate   Additional Comments  : POST STRESS LVEF BY QGS = 59% Normal wall motion.    Zannie Cove [facility]/Anesthesiology Phone [phone] 08/08/2020 9:50 AM

## 2020-08-09 ENCOUNTER — Inpatient Hospital Stay (HOSPITAL_COMMUNITY): Payer: Medicare Other | Admitting: Physician Assistant

## 2020-08-09 ENCOUNTER — Inpatient Hospital Stay (HOSPITAL_COMMUNITY)
Admission: RE | Admit: 2020-08-09 | Discharge: 2020-08-14 | DRG: 460 | Disposition: A | Discharge status: Home Health | Payer: Medicare Other | Attending: Neurosurgery | Admitting: Neurosurgery

## 2020-08-09 ENCOUNTER — Inpatient Hospital Stay (HOSPITAL_COMMUNITY): Payer: Medicare Other

## 2020-08-09 ENCOUNTER — Encounter (HOSPITAL_COMMUNITY): Payer: Self-pay | Admitting: Neurosurgery

## 2020-08-09 ENCOUNTER — Other Ambulatory Visit: Payer: Self-pay

## 2020-08-09 ENCOUNTER — Ambulatory Visit: Payer: Medicare Other | Admitting: Physical Therapy

## 2020-08-09 ENCOUNTER — Encounter (HOSPITAL_COMMUNITY)
Admission: RE | Disposition: A | Discharge status: Home / Self Care | Payer: Self-pay | Source: Home / Self Care | Attending: Neurosurgery

## 2020-08-09 DIAGNOSIS — G9741 Accidental puncture or laceration of dura during a procedure: Secondary | ICD-10-CM | POA: Diagnosis not present

## 2020-08-09 DIAGNOSIS — Z20822 Contact with and (suspected) exposure to covid-19: Secondary | ICD-10-CM | POA: Diagnosis present

## 2020-08-09 DIAGNOSIS — Y793 Surgical instruments, materials and orthopedic devices (including sutures) associated with adverse incidents: Secondary | ICD-10-CM | POA: Diagnosis present

## 2020-08-09 DIAGNOSIS — I340 Nonrheumatic mitral (valve) insufficiency: Secondary | ICD-10-CM | POA: Diagnosis present

## 2020-08-09 DIAGNOSIS — T84226A Displacement of internal fixation device of vertebrae, initial encounter: Secondary | ICD-10-CM | POA: Diagnosis present

## 2020-08-09 DIAGNOSIS — M960 Pseudarthrosis after fusion or arthrodesis: Principal | ICD-10-CM | POA: Diagnosis present

## 2020-08-09 DIAGNOSIS — M532X6 Spinal instabilities, lumbar region: Secondary | ICD-10-CM

## 2020-08-09 DIAGNOSIS — Z9682 Presence of neurostimulator: Secondary | ICD-10-CM

## 2020-08-09 DIAGNOSIS — Z419 Encounter for procedure for purposes other than remedying health state, unspecified: Secondary | ICD-10-CM

## 2020-08-09 HISTORY — DX: Allergy status to unspecified drugs, medicaments and biological substances: Z88.9

## 2020-08-09 HISTORY — DX: Cardiac arrhythmia, unspecified: I49.9

## 2020-08-09 HISTORY — DX: Essential (primary) hypertension: I10

## 2020-08-09 HISTORY — DX: Myoneural disorder, unspecified: G70.9

## 2020-08-09 HISTORY — DX: Unspecified osteoarthritis, unspecified site: M19.90

## 2020-08-09 LAB — CBC
HCT: 43 % (ref 39.0–52.0)
Hemoglobin: 13.4 g/dL (ref 13.0–17.0)
MCH: 28.3 pg (ref 26.0–34.0)
MCHC: 31.2 g/dL (ref 30.0–36.0)
MCV: 90.9 fL (ref 80.0–100.0)
Platelets: 184 10*3/uL (ref 150–400)
RBC: 4.73 MIL/uL (ref 4.22–5.81)
RDW: 13.2 % (ref 11.5–15.5)
WBC: 8.2 10*3/uL (ref 4.0–10.5)
nRBC: 0 % (ref 0.0–0.2)

## 2020-08-09 LAB — BASIC METABOLIC PANEL
Anion gap: 12 (ref 5–15)
BUN: 15 mg/dL (ref 8–23)
CO2: 25 mmol/L (ref 22–32)
Calcium: 8.9 mg/dL (ref 8.9–10.3)
Chloride: 100 mmol/L (ref 98–111)
Creatinine, Ser: 1.03 mg/dL (ref 0.61–1.24)
GFR, Estimated: >60 mL/min (ref >60)
Glucose, Bld: 98 mg/dL (ref 70–99)
Potassium: 3.9 mmol/L (ref 3.5–5.1)
Sodium: 137 mmol/L (ref 135–145)

## 2020-08-09 LAB — ABO/RH: ABO/RH(D): A NEG

## 2020-08-09 LAB — TYPE AND SCREEN
ABO/RH(D): A NEG
Antibody Screen: NEGATIVE

## 2020-08-09 LAB — SURGICAL PCR SCREEN
MRSA, PCR: NEGATIVE
Staphylococcus aureus: NEGATIVE

## 2020-08-09 SURGERY — POSTERIOR LUMBAR FUSION 1 LEVEL
Anesthesia: General | Site: Back

## 2020-08-09 MED ORDER — CHLORHEXIDINE GLUCONATE CLOTH 2 % EX PADS: 6.0000 | MEDICATED_PAD | Freq: Once | CUTANEOUS | Status: DC

## 2020-08-09 MED ORDER — HYDROCHLOROTHIAZIDE 12.5 MG PO CAPS
12.5000 mg | ORAL_CAPSULE | Freq: Every evening | ORAL | Status: DC
  Administered 2020-08-10 – 2020-08-13 (×4): 12.5 mg via ORAL
  Filled 2020-08-09 (×4): qty 1

## 2020-08-09 MED ORDER — PROPOFOL 10 MG/ML IV BOLUS
INTRAVENOUS | Status: AC
  Filled 2020-08-09: qty 20

## 2020-08-09 MED ORDER — KCL IN DEXTROSE-NACL 20-5-0.45 MEQ/L-%-% IV SOLN
INTRAVENOUS | Status: DC
  Filled 2020-08-09 (×7): qty 1000

## 2020-08-09 MED ORDER — PROPOFOL 10 MG/ML IV BOLUS
INTRAVENOUS | Status: AC
  Filled 2020-08-09: qty 40

## 2020-08-09 MED ORDER — SODIUM CHLORIDE 0.9% FLUSH: 3.0000 mL | INTRAVENOUS | Status: DC | PRN

## 2020-08-09 MED ORDER — METHOCARBAMOL 750 MG PO TABS: 750.0000 mg | ORAL_TABLET | ORAL | Status: DC | PRN

## 2020-08-09 MED ORDER — SODIUM CHLORIDE 0.9% FLUSH
3.0000 mL | Freq: Two times a day (BID) | INTRAVENOUS | Status: DC
  Administered 2020-08-09 – 2020-08-14 (×8): 3 mL via INTRAVENOUS

## 2020-08-09 MED ORDER — LIDOCAINE 2% (20 MG/ML) 5 ML SYRINGE
INTRAMUSCULAR | Status: DC | PRN
  Administered 2020-08-09: 60 mg via INTRAVENOUS

## 2020-08-09 MED ORDER — ONDANSETRON HCL 4 MG/2ML IJ SOLN
4.0000 mg | Freq: Four times a day (QID) | INTRAMUSCULAR | Status: DC | PRN
  Administered 2020-08-09: 4 mg via INTRAVENOUS
  Filled 2020-08-09: qty 2

## 2020-08-09 MED ORDER — ORAL CARE MOUTH RINSE: 15.0000 mL | Freq: Once | OROMUCOSAL | Status: AC

## 2020-08-09 MED ORDER — HYDROCODONE-ACETAMINOPHEN 5-325 MG PO TABS: 1.0000 | ORAL_TABLET | ORAL | Status: DC | PRN

## 2020-08-09 MED ORDER — HYDROMORPHONE HCL 1 MG/ML IJ SOLN
INTRAMUSCULAR | Status: AC
  Filled 2020-08-09: qty 1

## 2020-08-09 MED ORDER — LIDOCAINE-EPINEPHRINE 1 %-1:100000 IJ SOLN
INTRAMUSCULAR | Status: DC | PRN
  Administered 2020-08-09: 10 mL

## 2020-08-09 MED ORDER — PROMETHAZINE HCL 25 MG/ML IJ SOLN: 6.2500 mg | INTRAMUSCULAR | Status: DC | PRN

## 2020-08-09 MED ORDER — HEMOSTATIC AGENTS (NO CHARGE) OPTIME
TOPICAL | Status: DC | PRN
  Administered 2020-08-09: 1 via TOPICAL

## 2020-08-09 MED ORDER — BUPIVACAINE LIPOSOME 1.3 % IJ SUSP
20.0000 mL | INTRAMUSCULAR | Status: AC
  Administered 2020-08-09: 20 mL
  Filled 2020-08-09: qty 20

## 2020-08-09 MED ORDER — SUGAMMADEX SODIUM 200 MG/2ML IV SOLN
INTRAVENOUS | Status: DC | PRN
  Administered 2020-08-09: 300 mg via INTRAVENOUS

## 2020-08-09 MED ORDER — DEXAMETHASONE SODIUM PHOSPHATE 10 MG/ML IJ SOLN
INTRAMUSCULAR | Status: AC
  Filled 2020-08-09: qty 1

## 2020-08-09 MED ORDER — LACTATED RINGERS IV SOLN: INTRAVENOUS | Status: DC

## 2020-08-09 MED ORDER — VITAMIN B-12 1000 MCG PO TABS
1000.0000 ug | ORAL_TABLET | Freq: Every evening | ORAL | Status: DC
  Administered 2020-08-10 – 2020-08-13 (×4): 1000 ug via ORAL
  Filled 2020-08-09 (×4): qty 1

## 2020-08-09 MED ORDER — HYDROMORPHONE HCL 1 MG/ML IJ SOLN
0.2500 mg | INTRAMUSCULAR | Status: DC | PRN
  Administered 2020-08-09: 1 mg via INTRAVENOUS

## 2020-08-09 MED ORDER — PHENYLEPHRINE 40 MCG/ML (10ML) SYRINGE FOR IV PUSH (FOR BLOOD PRESSURE SUPPORT)
PREFILLED_SYRINGE | INTRAVENOUS | Status: AC
  Filled 2020-08-09: qty 10

## 2020-08-09 MED ORDER — THROMBIN 20000 UNITS EX SOLR
CUTANEOUS | Status: AC
  Filled 2020-08-09: qty 20000

## 2020-08-09 MED ORDER — HYDROCODONE-ACETAMINOPHEN 5-325 MG PO TABS
1.0000 | ORAL_TABLET | ORAL | Status: DC | PRN
  Administered 2020-08-09 – 2020-08-12 (×9): 1 via ORAL
  Filled 2020-08-09 (×9): qty 1

## 2020-08-09 MED ORDER — CHLORHEXIDINE GLUCONATE 0.12 % MT SOLN
15.0000 mL | Freq: Once | OROMUCOSAL | Status: AC
  Administered 2020-08-09: 15 mL via OROMUCOSAL
  Filled 2020-08-09: qty 15

## 2020-08-09 MED ORDER — FENTANYL CITRATE (PF) 250 MCG/5ML IJ SOLN
INTRAMUSCULAR | Status: AC
  Filled 2020-08-09: qty 5

## 2020-08-09 MED ORDER — SODIUM CHLORIDE 0.9 % IV SOLN: 250.0000 mL | INTRAVENOUS | Status: DC

## 2020-08-09 MED ORDER — POLYETHYLENE GLYCOL 3350 17 G PO PACK
17.0000 g | PACK | Freq: Every day | ORAL | Status: DC
  Administered 2020-08-10 – 2020-08-14 (×3): 17 g via ORAL
  Filled 2020-08-09 (×4): qty 1

## 2020-08-09 MED ORDER — PHENYLEPHRINE 40 MCG/ML (10ML) SYRINGE FOR IV PUSH (FOR BLOOD PRESSURE SUPPORT)
PREFILLED_SYRINGE | INTRAVENOUS | Status: DC | PRN
  Administered 2020-08-09 (×3): 80 ug via INTRAVENOUS
  Administered 2020-08-09: 40 ug via INTRAVENOUS
  Administered 2020-08-09: 80 ug via INTRAVENOUS

## 2020-08-09 MED ORDER — ONDANSETRON HCL 4 MG/2ML IJ SOLN
INTRAMUSCULAR | Status: AC
  Filled 2020-08-09: qty 2

## 2020-08-09 MED ORDER — MIDAZOLAM HCL 2 MG/2ML IJ SOLN
INTRAMUSCULAR | Status: AC
  Filled 2020-08-09: qty 2

## 2020-08-09 MED ORDER — ACETAMINOPHEN 325 MG PO TABS
650.0000 mg | ORAL_TABLET | ORAL | Status: DC | PRN
  Administered 2020-08-10 – 2020-08-14 (×7): 650 mg via ORAL
  Filled 2020-08-09 (×7): qty 2

## 2020-08-09 MED ORDER — DOCUSATE SODIUM 100 MG PO CAPS
100.0000 mg | ORAL_CAPSULE | Freq: Two times a day (BID) | ORAL | Status: DC
  Administered 2020-08-09 – 2020-08-14 (×8): 100 mg via ORAL
  Filled 2020-08-09 (×9): qty 1

## 2020-08-09 MED ORDER — OXYCODONE HCL 5 MG PO TABS
10.0000 mg | ORAL_TABLET | ORAL | Status: DC | PRN
  Administered 2020-08-09 – 2020-08-12 (×7): 10 mg via ORAL
  Filled 2020-08-09 (×7): qty 2

## 2020-08-09 MED ORDER — ZOLPIDEM TARTRATE 5 MG PO TABS
5.0000 mg | ORAL_TABLET | Freq: Every evening | ORAL | Status: DC | PRN
  Administered 2020-08-11 (×2): 5 mg via ORAL
  Filled 2020-08-09 (×2): qty 1

## 2020-08-09 MED ORDER — HYDROMORPHONE HCL 1 MG/ML IJ SOLN
INTRAMUSCULAR | Status: AC
  Filled 2020-08-09: qty 0.5

## 2020-08-09 MED ORDER — EPHEDRINE 5 MG/ML INJ
INTRAVENOUS | Status: AC
  Filled 2020-08-09: qty 10

## 2020-08-09 MED ORDER — ACETAMINOPHEN 10 MG/ML IV SOLN
INTRAVENOUS | Status: DC | PRN
  Administered 2020-08-09: 1000 mg via INTRAVENOUS

## 2020-08-09 MED ORDER — PHENYLEPHRINE HCL (PRESSORS) 10 MG/ML IV SOLN
INTRAVENOUS | Status: AC
  Filled 2020-08-09: qty 1

## 2020-08-09 MED ORDER — PHENYLEPHRINE HCL-NACL 10-0.9 MG/250ML-% IV SOLN
INTRAVENOUS | Status: DC | PRN
Start: 2020-08-09 — End: 2020-08-09
  Administered 2020-08-09: 25 ug/min via INTRAVENOUS

## 2020-08-09 MED ORDER — PHENOL 1.4 % MT LIQD: 1.0000 | OROMUCOSAL | Status: DC | PRN

## 2020-08-09 MED ORDER — DEXTROSE 5 % IV SOLN
INTRAVENOUS | Status: DC | PRN
  Administered 2020-08-09 (×2): 3 g via INTRAVENOUS

## 2020-08-09 MED ORDER — BUPIVACAINE HCL (PF) 0.5 % IJ SOLN
INTRAMUSCULAR | Status: DC | PRN
  Administered 2020-08-09: 10 mL

## 2020-08-09 MED ORDER — METHOCARBAMOL 500 MG PO TABS
500.0000 mg | ORAL_TABLET | Freq: Four times a day (QID) | ORAL | Status: DC | PRN
  Administered 2020-08-09 – 2020-08-14 (×10): 500 mg via ORAL
  Filled 2020-08-09 (×10): qty 1

## 2020-08-09 MED ORDER — LOSARTAN POTASSIUM-HCTZ 50-12.5 MG PO TABS: 1.0000 | ORAL_TABLET | Freq: Every evening | ORAL | Status: DC

## 2020-08-09 MED ORDER — ONDANSETRON HCL 4 MG/2ML IJ SOLN
INTRAMUSCULAR | Status: DC | PRN
  Administered 2020-08-09: 4 mg via INTRAVENOUS

## 2020-08-09 MED ORDER — FLEET ENEMA 7-19 GM/118ML RE ENEM: 1.0000 | ENEMA | Freq: Once | RECTAL | Status: DC | PRN

## 2020-08-09 MED ORDER — ALUM & MAG HYDROXIDE-SIMETH 200-200-20 MG/5ML PO SUSP: 30.0000 mL | Freq: Four times a day (QID) | ORAL | Status: DC | PRN

## 2020-08-09 MED ORDER — ACETAMINOPHEN 10 MG/ML IV SOLN: 1000.0000 mg | Freq: Once | INTRAVENOUS | Status: DC | PRN

## 2020-08-09 MED ORDER — METHOCARBAMOL 1000 MG/10ML IJ SOLN
500.0000 mg | Freq: Four times a day (QID) | INTRAVENOUS | Status: DC | PRN
  Filled 2020-08-09: qty 5

## 2020-08-09 MED ORDER — LIDOCAINE 2% (20 MG/ML) 5 ML SYRINGE
INTRAMUSCULAR | Status: AC
  Filled 2020-08-09: qty 5

## 2020-08-09 MED ORDER — BACITRACIN ZINC 500 UNIT/GM EX OINT
TOPICAL_OINTMENT | CUTANEOUS | Status: AC
  Filled 2020-08-09: qty 28.35

## 2020-08-09 MED ORDER — DOCUSATE SODIUM 100 MG PO CAPS: 100.0000 mg | ORAL_CAPSULE | Freq: Every day | ORAL | Status: DC

## 2020-08-09 MED ORDER — HYDROMORPHONE HCL 1 MG/ML IJ SOLN
INTRAMUSCULAR | Status: DC | PRN
  Administered 2020-08-09: .5 mg via INTRAVENOUS

## 2020-08-09 MED ORDER — THROMBIN 5000 UNITS EX SOLR
CUTANEOUS | Status: AC
  Filled 2020-08-09: qty 5000

## 2020-08-09 MED ORDER — SUCCINYLCHOLINE CHLORIDE 200 MG/10ML IV SOSY
PREFILLED_SYRINGE | INTRAVENOUS | Status: AC
  Filled 2020-08-09: qty 10

## 2020-08-09 MED ORDER — CEFAZOLIN SODIUM-DEXTROSE 2-4 GM/100ML-% IV SOLN
2.0000 g | Freq: Three times a day (TID) | INTRAVENOUS | Status: AC
  Administered 2020-08-09 – 2020-08-10 (×2): 2 g via INTRAVENOUS
  Filled 2020-08-09 (×2): qty 100

## 2020-08-09 MED ORDER — MENTHOL 3 MG MT LOZG: 1.0000 | LOZENGE | OROMUCOSAL | Status: DC | PRN

## 2020-08-09 MED ORDER — OXYCODONE HCL 5 MG PO TABS: 5.0000 mg | ORAL_TABLET | Freq: Once | ORAL | Status: DC | PRN

## 2020-08-09 MED ORDER — PANTOPRAZOLE SODIUM 40 MG IV SOLR
40.0000 mg | Freq: Every day | INTRAVENOUS | Status: DC
  Administered 2020-08-09: 40 mg via INTRAVENOUS
  Filled 2020-08-09: qty 40

## 2020-08-09 MED ORDER — ROCURONIUM BROMIDE 10 MG/ML (PF) SYRINGE
PREFILLED_SYRINGE | INTRAVENOUS | Status: AC
  Filled 2020-08-09: qty 10

## 2020-08-09 MED ORDER — EPHEDRINE SULFATE 50 MG/ML IJ SOLN
INTRAMUSCULAR | Status: DC | PRN
  Administered 2020-08-09: 5 mg via INTRAVENOUS

## 2020-08-09 MED ORDER — TRAMADOL HCL 50 MG PO TABS: 50.0000 mg | ORAL_TABLET | Freq: Four times a day (QID) | ORAL | Status: DC | PRN

## 2020-08-09 MED ORDER — THROMBIN 5000 UNITS EX SOLR: OROMUCOSAL | Status: DC | PRN

## 2020-08-09 MED ORDER — BUPIVACAINE HCL (PF) 0.5 % IJ SOLN
INTRAMUSCULAR | Status: AC
  Filled 2020-08-09: qty 30

## 2020-08-09 MED ORDER — BISACODYL 10 MG RE SUPP
10.0000 mg | Freq: Every day | RECTAL | Status: DC | PRN
  Administered 2020-08-13: 10 mg via RECTAL
  Filled 2020-08-09: qty 1

## 2020-08-09 MED ORDER — ROCURONIUM BROMIDE 10 MG/ML (PF) SYRINGE
PREFILLED_SYRINGE | INTRAVENOUS | Status: AC
  Filled 2020-08-09: qty 20

## 2020-08-09 MED ORDER — MIDAZOLAM HCL 5 MG/5ML IJ SOLN
INTRAMUSCULAR | Status: DC | PRN
  Administered 2020-08-09: 2 mg via INTRAVENOUS

## 2020-08-09 MED ORDER — OXYCODONE HCL 5 MG/5ML PO SOLN: 5.0000 mg | Freq: Once | ORAL | Status: DC | PRN

## 2020-08-09 MED ORDER — POLYETHYLENE GLYCOL 3350 17 G PO PACK: 17.0000 g | PACK | Freq: Every day | ORAL | Status: DC | PRN

## 2020-08-09 MED ORDER — ACETAMINOPHEN 650 MG RE SUPP: 650.0000 mg | RECTAL | Status: DC | PRN

## 2020-08-09 MED ORDER — LIDOCAINE-EPINEPHRINE 1 %-1:100000 IJ SOLN
INTRAMUSCULAR | Status: AC
  Filled 2020-08-09: qty 1

## 2020-08-09 MED ORDER — FLUTICASONE PROPIONATE 50 MCG/ACT NA SUSP
1.0000 | Freq: Every day | NASAL | Status: DC
  Administered 2020-08-10 – 2020-08-14 (×5): 1 via NASAL
  Filled 2020-08-09: qty 16

## 2020-08-09 MED ORDER — ROCURONIUM BROMIDE 10 MG/ML (PF) SYRINGE
PREFILLED_SYRINGE | INTRAVENOUS | Status: DC | PRN
  Administered 2020-08-09: 60 mg via INTRAVENOUS
  Administered 2020-08-09 (×4): 20 mg via INTRAVENOUS

## 2020-08-09 MED ORDER — PROPOFOL 10 MG/ML IV BOLUS
INTRAVENOUS | Status: DC | PRN
  Administered 2020-08-09: 200 mg via INTRAVENOUS

## 2020-08-09 MED ORDER — FENTANYL CITRATE (PF) 100 MCG/2ML IJ SOLN
INTRAMUSCULAR | Status: DC | PRN
  Administered 2020-08-09: 25 ug via INTRAVENOUS
  Administered 2020-08-09: 50 ug via INTRAVENOUS
  Administered 2020-08-09: 25 ug via INTRAVENOUS
  Administered 2020-08-09 (×2): 50 ug via INTRAVENOUS
  Administered 2020-08-09 (×3): 25 ug via INTRAVENOUS
  Administered 2020-08-09: 100 ug via INTRAVENOUS
  Administered 2020-08-09: 50 ug via INTRAVENOUS

## 2020-08-09 MED ORDER — 0.9 % SODIUM CHLORIDE (POUR BTL) OPTIME
TOPICAL | Status: DC | PRN
  Administered 2020-08-09 (×2): 1000 mL

## 2020-08-09 MED ORDER — ACETAMINOPHEN 10 MG/ML IV SOLN
INTRAVENOUS | Status: AC
  Filled 2020-08-09: qty 100

## 2020-08-09 MED ORDER — DEXAMETHASONE SODIUM PHOSPHATE 10 MG/ML IJ SOLN
INTRAMUSCULAR | Status: DC | PRN
  Administered 2020-08-09: 8 mg via INTRAVENOUS

## 2020-08-09 MED ORDER — THROMBIN 20000 UNITS EX SOLR: CUTANEOUS | Status: DC | PRN

## 2020-08-09 MED ORDER — ALBUMIN HUMAN 5 % IV SOLN
INTRAVENOUS | Status: DC | PRN
Start: 2020-08-09 — End: 2020-08-09

## 2020-08-09 MED ORDER — HYDROMORPHONE HCL 1 MG/ML IJ SOLN
0.5000 mg | INTRAMUSCULAR | Status: DC | PRN
  Administered 2020-08-09: 0.5 mg via INTRAVENOUS
  Filled 2020-08-09: qty 1

## 2020-08-09 MED ORDER — ACETAMINOPHEN 500 MG PO TABS
1000.0000 mg | ORAL_TABLET | Freq: Three times a day (TID) | ORAL | Status: DC | PRN
  Administered 2020-08-10 – 2020-08-13 (×2): 1000 mg via ORAL
  Filled 2020-08-09 (×2): qty 2

## 2020-08-09 MED ORDER — LOSARTAN POTASSIUM 50 MG PO TABS
50.0000 mg | ORAL_TABLET | Freq: Every evening | ORAL | Status: DC
  Administered 2020-08-10 – 2020-08-13 (×4): 50 mg via ORAL
  Filled 2020-08-09 (×4): qty 1

## 2020-08-09 MED ORDER — ONDANSETRON HCL 4 MG PO TABS: 4.0000 mg | ORAL_TABLET | Freq: Four times a day (QID) | ORAL | Status: DC | PRN

## 2020-08-09 SURGICAL SUPPLY — 88 items
BAG URINE DRAIN 2000ML AR STRL (UROLOGICAL SUPPLIES) ×2 IMPLANT
BAND RUBBER #18 3X1/16 STRL (MISCELLANEOUS) ×4 IMPLANT
BASKET BONE COLLECTION (BASKET) ×2 IMPLANT
BENZOIN TINCTURE PRP APPL 2/3 (GAUZE/BANDAGES/DRESSINGS) ×4 IMPLANT
BLADE CLIPPER SURG (BLADE) IMPLANT
BONE CANC CHIPS 20CC PCAN1/4 (Bone Implant) ×2 IMPLANT
BONE CANC CHIPS 40CC CAN1/2 (Bone Implant) ×4 IMPLANT
BUR MATCHSTICK NEURO 3.0 LAGG (BURR) ×2 IMPLANT
BUR PRECISION FLUTE 5.0 (BURR) ×2 IMPLANT
CANISTER SUCT 3000ML PPV (MISCELLANEOUS) ×2 IMPLANT
CARTRIDGE OIL MAESTRO DRILL (MISCELLANEOUS) ×1 IMPLANT
CATH CONDOM 29MM MD SILICONE (CATHETERS) ×2 IMPLANT
CHIPS CANC BONE 20CC PCAN1/4 (Bone Implant) ×1 IMPLANT
CHIPS CANC BONE 40CC CAN1/2 (Bone Implant) ×2 IMPLANT
CNTNR URN SCR LID CUP LEK RST (MISCELLANEOUS) ×1 IMPLANT
CONT SPEC 4OZ STRL OR WHT (MISCELLANEOUS) ×1
COVER BACK TABLE 60X90IN (DRAPES) ×2 IMPLANT
COVER WAND RF STERILE (DRAPES) IMPLANT
DECANTER SPIKE VIAL GLASS SM (MISCELLANEOUS) ×2 IMPLANT
DERMABOND ADVANCED (GAUZE/BANDAGES/DRESSINGS) ×1
DERMABOND ADVANCED .7 DNX12 (GAUZE/BANDAGES/DRESSINGS) ×1 IMPLANT
DIFFUSER DRILL AIR PNEUMATIC (MISCELLANEOUS) ×2 IMPLANT
DRAPE C-ARM 42X72 X-RAY (DRAPES) ×2 IMPLANT
DRAPE C-ARMOR (DRAPES) ×2 IMPLANT
DRAPE LAPAROTOMY 100X72X124 (DRAPES) ×2 IMPLANT
DRAPE MICROSCOPE LEICA 54X105 (DRAPES) ×2 IMPLANT
DRAPE SURG 17X23 STRL (DRAPES) ×2 IMPLANT
DRSG OPSITE POSTOP 4X10 (GAUZE/BANDAGES/DRESSINGS) ×2 IMPLANT
DURAPREP 26ML APPLICATOR (WOUND CARE) ×2 IMPLANT
ELECT BLADE 4.0 EZ CLEAN MEGAD (MISCELLANEOUS) ×2
ELECT REM PT RETURN 9FT ADLT (ELECTROSURGICAL) ×2
ELECTRODE BLDE 4.0 EZ CLN MEGD (MISCELLANEOUS) ×1 IMPLANT
ELECTRODE REM PT RTRN 9FT ADLT (ELECTROSURGICAL) ×1 IMPLANT
EVACUATOR 1/8 PVC DRAIN (DRAIN) IMPLANT
GAUZE 4X4 16PLY RFD (DISPOSABLE) ×4 IMPLANT
GAUZE SPONGE 4X4 12PLY STRL (GAUZE/BANDAGES/DRESSINGS) ×2 IMPLANT
GLOVE BIO SURGEON STRL SZ7.5 (GLOVE) ×6 IMPLANT
GLOVE BIO SURGEON STRL SZ8 (GLOVE) ×12 IMPLANT
GLOVE BIOGEL PI IND STRL 8.5 (GLOVE) ×8 IMPLANT
GLOVE BIOGEL PI INDICATOR 8.5 (GLOVE) ×8
GLOVE ECLIPSE 8.0 STRL XLNG CF (GLOVE) ×10 IMPLANT
GLOVE EXAM NITRILE XL STR (GLOVE) IMPLANT
GLOVE INDICATOR 7.5 STRL GRN (GLOVE) ×6 IMPLANT
GLOVE INDICATOR 8.0 STRL GRN (GLOVE) ×2 IMPLANT
GLOVE SRG 8 PF TXTR STRL LF DI (GLOVE) ×2 IMPLANT
GLOVE SURG UNDER POLY LF SZ8 (GLOVE) ×2
GOWN STRL REUS W/ TWL LRG LVL3 (GOWN DISPOSABLE) IMPLANT
GOWN STRL REUS W/ TWL XL LVL3 (GOWN DISPOSABLE) ×3 IMPLANT
GOWN STRL REUS W/TWL 2XL LVL3 (GOWN DISPOSABLE) ×4 IMPLANT
GOWN STRL REUS W/TWL LRG LVL3 (GOWN DISPOSABLE)
GOWN STRL REUS W/TWL XL LVL3 (GOWN DISPOSABLE) ×3
HEMOSTAT POWDER KIT SURGIFOAM (HEMOSTASIS) ×2 IMPLANT
KIT BASIN OR (CUSTOM PROCEDURE TRAY) ×2 IMPLANT
KIT INFUSE SMALL (Orthopedic Implant) ×2 IMPLANT
KIT POSITION SURG JACKSON T1 (MISCELLANEOUS) ×2 IMPLANT
KIT TURNOVER KIT B (KITS) ×2 IMPLANT
MILL MEDIUM DISP (BLADE) IMPLANT
NEEDLE HYPO 21X1.5 SAFETY (NEEDLE) ×2 IMPLANT
NEEDLE HYPO 25X1 1.5 SAFETY (NEEDLE) ×2 IMPLANT
NEEDLE SPNL 18GX3.5 QUINCKE PK (NEEDLE) IMPLANT
NS IRRIG 1000ML POUR BTL (IV SOLUTION) ×2 IMPLANT
OIL CARTRIDGE MAESTRO DRILL (MISCELLANEOUS) ×2
PACK LAMINECTOMY NEURO (CUSTOM PROCEDURE TRAY) ×2 IMPLANT
PAD ARMBOARD 7.5X6 YLW CONV (MISCELLANEOUS) ×6 IMPLANT
PATTIES SURGICAL .5 X.5 (GAUZE/BANDAGES/DRESSINGS) IMPLANT
PATTIES SURGICAL .5 X1 (DISPOSABLE) IMPLANT
PATTIES SURGICAL 1X1 (DISPOSABLE) IMPLANT
ROD RELINE-O 5.5X300 STRT NS (Rod) ×1 IMPLANT
ROD RELINE-O 5.5X300MM STRT (Rod) ×1 IMPLANT
SCREW LOCK RELINE 5.5 TULIP (Screw) ×24 IMPLANT
SCREW RELINE O POLY 7.5X55MM (Screw) ×4 IMPLANT
SCREW RELINE-O 6.5X55 POLY (Screw) ×4 IMPLANT
SEALANT ADHERUS EXTEND TIP (MISCELLANEOUS) ×4 IMPLANT
SPONGE LAP 4X18 RFD (DISPOSABLE) IMPLANT
SPONGE SURGIFOAM ABS GEL 100 (HEMOSTASIS) ×2 IMPLANT
STAPLER SKIN PROX WIDE 3.9 (STAPLE) IMPLANT
SUT ETHILON 3 0 FSL (SUTURE) ×4 IMPLANT
SUT ETHILON 3 0 PS 1 (SUTURE) ×2 IMPLANT
SUT VIC AB 1 CT1 18XBRD ANBCTR (SUTURE) ×3 IMPLANT
SUT VIC AB 1 CT1 8-18 (SUTURE) ×3
SUT VIC AB 2-0 CT1 18 (SUTURE) ×4 IMPLANT
SUT VIC AB 3-0 SH 8-18 (SUTURE) ×4 IMPLANT
SYR 20ML LL LF (SYRINGE) ×2 IMPLANT
SYR 5ML LL (SYRINGE) IMPLANT
TOWEL GREEN STERILE (TOWEL DISPOSABLE) ×2 IMPLANT
TOWEL GREEN STERILE FF (TOWEL DISPOSABLE) ×2 IMPLANT
TRAY FOLEY MTR SLVR 16FR STAT (SET/KITS/TRAYS/PACK) ×2 IMPLANT
WATER STERILE IRR 1000ML POUR (IV SOLUTION) ×2 IMPLANT

## 2020-08-09 NOTE — Progress Notes (Signed)
Orthopedic Tech Progress Note Patient Details:  EFE FAZZINO 15-Aug-1955 [id number] Patient has BRACE Patient ID: [name], male   DOB: [birth year], 65 y.o.   MRN: [id number]   Donald Pore 08/09/2020, 4:42 PM

## 2020-08-09 NOTE — Anesthesia Procedure Notes (Signed)
Procedure Name: Intubation Date/Time: 08/09/2020 7:43 AM Performed by: Clearnce Sorrel, CRNA Pre-anesthesia Checklist: Patient identified, Emergency Drugs available, Suction available and Patient being monitored Patient Re-evaluated:Patient Re-evaluated prior to induction Oxygen Delivery Method: Circle system utilized Preoxygenation: Pre-oxygenation with 100% oxygen Induction Type: IV induction Ventilation: Mask ventilation without difficulty and Oral airway inserted - appropriate to patient size Laryngoscope Size: Mac and 4 Grade View: Grade III Tube type: Oral Tube size: 7.5 mm Number of attempts: 1 Airway Equipment and Method: Stylet and Oral airway Placement Confirmation: ETT inserted through vocal cords under direct vision,  positive ETCO2 and breath sounds checked- equal and bilateral Secured at: 24 cm Tube secured with: Tape Dental Injury: Teeth and Oropharynx as per pre-operative assessment

## 2020-08-09 NOTE — Anesthesia Postprocedure Evaluation (Signed)
Anesthesia Post Note  Patient: Thomas Savage  Procedure(s) Performed: Revision of hardware at Lumbar one, lumbar two, lumbar three, lumbar four, lumbar five and scaral one. Redo posterior lumbar interbody fusion at lumbar two and lumbar three. pedical screws at lumbat one, lumbar two, lumbar three, lumbar four, lumbar five and scaral one. Posterolateral arthrodesis. (N/A Back)     Patient location during evaluation: PACU Anesthesia Type: General Level of consciousness: awake Pain management: pain level controlled Vital Signs Assessment: post-procedure vital signs reviewed and stable Respiratory status: spontaneous breathing, nonlabored ventilation, respiratory function stable and patient connected to nasal cannula oxygen Cardiovascular status: blood pressure returned to baseline and stable Postop Assessment: no apparent nausea or vomiting Anesthetic complications: no   No complications documented.  Last Vitals:  Vitals:   08/09/20 1430 08/09/20 1454  BP:  120/78  Pulse: 71 68  Resp: (!) 7 15  Temp:  (!) 35.6 C  SpO2: 99% 97%    Last Pain:  Vitals:   08/09/20 1648  TempSrc:   PainSc: 8                  Ryan P Ellender

## 2020-08-09 NOTE — H&P (Signed)
Patient ID:   [id number] Patient: Thomas Savage  Date of Birth: 1956/06/17 Visit Type: Office Visit   Date: 08/04/2020 09:45 AM Provider: Danae Orleans. Venetia Maxon MD   This 65 year old male presents for Back pain.  HISTORY OF PRESENT ILLNESS: 1.  Back pain  This appointment was conducted with the patient via telehealth due to the COVID-19 pandemic.  The patient is continuing to have significant pain.  He notes pain into his right leg which he now says is steady at about 6/10.  I reviewed his myelogram and post myelographic CT scan which demonstrates retropulsion of cages at the L2-3 level.  There appears to be a fracture of the lamina of L2 on the left with some haloing  around the left L2 screw.  I am not sure whether there is a fracture on the right side which is not as well visualized.  The patient is currently describing pain at 7/10 in severity.  Based on the progressive worsening of his symptoms and the retropulsion of his cages, I have recommended proceeding with revision surgery.  This will consist of either replacing the L2-3 cages or tamping them into the disc space with posterior compression with the hardware after that.  I am concerned about the L2 screws and will assess them at the time of surgery.  If there appears to be a fracture of the spine he will likely need to have this screws extended up to the L1 level.  This will not include in interbody surgery.  The patient understands my recommendations for surgery and wishes to proceed.  We will do so on an expedited basis given severity of his pain complaints and attendant weakness      Medical/Surgical/Interim History Reviewed, no change.     Family History: Reviewed, no changes.    Social History: Reviewed, no changes.   MEDICATIONS: (added, continued or stopped this visit) Started Medication Directions Instruction Stopped  Flonase Allergy Relief 50 mcg/actuation nasal spray,suspension     meloxicam 15 mg  tablet     methocarbamol 750 mg tablet     Stool Softener    07/22/2020 tramadol 50 mg tablet take 1 tablet by oral route  every 6 hours as needed    Tylenol Extra Strength 500 mg tablet       ALLERGIES: Ingredient Reaction Medication Name Comment MORPHINE     Reviewed, no changes.    PHYSICAL EXAM:  Vitals Date Temp F BP Pulse Ht In Wt Lb BMI BSA Pain Score 08/04/2020             IMPRESSION:  Recurrent lumbar radiculopathy with retropulsion of cages into the spinal canal at the L2-3 level.  PLAN: Urgent revision surgery to correct this problem  Orders: Diagnostic Procedures: Assessment Procedure M54.16 Lumbar Spine- AP/Lat  Assessment/Plan  # Detail Type Description  1. Assessment S/P lumbar spinal arthrodesis (Z98.1).     2. Assessment Lumbar pseudoarthrosis (S32.009K).     3. Assessment Right leg weakness (R29.898).     4. Assessment Lumbar radiculopathy (M54.16).                   Provider:  Danae Orleans. Venetia Maxon MD  08/05/2020 01:17 PM    Dictation edited by: Danae Orleans. Venetia Maxon    CC Providers: [employer]  [address] [facility] [place], [place] [address]-               Electronically signed by Danae Orleans Venetia Maxon MD on 08/05/2020  01:17 PM

## 2020-08-09 NOTE — Transfer of Care (Signed)
Immediate Anesthesia Transfer of Care Note  Patient: Thomas Savage  Procedure(s) Performed: Revision of hardware at Lumbar one, lumbar two, lumbar three, lumbar four, lumbar five and scaral one. Redo posterior lumbar interbody fusion at lumbar two and lumbar three. pedical screws at lumbat one, lumbar two, lumbar three, lumbar four, lumbar five and scaral one. Posterolateral arthrodesis. (N/A Back)  Patient Location: PACU  Anesthesia Type:General  Level of Consciousness: awake and drowsy  Airway & Oxygen Therapy: Patient Spontanous Breathing and Patient connected to face mask oxygen  Post-op Assessment: Report given to RN and Post -op Vital signs reviewed and stable  Post vital signs: Reviewed and stable  Last Vitals:  Vitals Value Taken Time  BP 122/83 08/09/20 1338  Temp    Pulse 73 08/09/20 1340  Resp 10 08/09/20 1340  SpO2 100 % 08/09/20 1340  Vitals shown include unvalidated device data.  Last Pain:  Vitals:   08/09/20 0629  TempSrc: Oral  PainSc: 3       Patients Stated Pain Goal: 2 (08/09/20 5520)  Complications: No complications documented.

## 2020-08-09 NOTE — Interval H&P Note (Signed)
History and Physical Interval Note:  08/09/2020 7:32 AM  Thomas Savage  has presented today for surgery, with the diagnosis of Lumbar pseudoarthrosis.  The various methods of treatment have been discussed with the patient and family. After consideration of risks, benefits and other options for treatment, the patient has consented to  Procedure(s) with comments: Exploration of Lumbar 2-3 fusion with possible revision of Lumbar 2 to Sacral 1 posterior hardware, possible extension of fusion to Lumbar 1 (N/A) - 3C/RM 19 as a surgical intervention.  The patient's history has been reviewed, patient examined, no change in status, stable for surgery.  I have reviewed the patient's chart and labs.  Questions were answered to the patient's satisfaction.     [name]

## 2020-08-09 NOTE — Progress Notes (Signed)
Awake, alert, conversant.  MAEW with good power.  Patient is doing well.  I have discussed his situation and need to lay flat with patient and his wife.

## 2020-08-09 NOTE — Brief Op Note (Signed)
08/09/2020  1:28 PM  PATIENT:  Thomas Savage  65 y.o. male  PRE-OPERATIVE DIAGNOSIS:  Lumbar pseudoarthrosis with retropulsed PLIF cages at L 23 and loosening of hardware at L 23 level with radiculopathy and lumbago  POST-OPERATIVE DIAGNOSIS:  Lumbar pseudoarthrosis with retropulsed PLIF cages at L 23 and loosening of hardware at L 23 level with radiculopathy and lumbago  PROCEDURE:  Procedure(s): Revision of hardware at Lumbar one, lumbar two, lumbar three, lumbar four, lumbar five and scaral one. Redo posterior lumbar interbody fusion at lumbar two and lumbar three. pedical screws at lumbat one, lumbar two, lumbar three, lumbar four, lumbar five and scaral one. Posterolateral arthrodesis. (N/A)  SURGEON:  Surgeon(s) and Role:    * Maeola Harman, MD - Primary    * Dawley, Alan Mulder, DO - Assisting  PHYSICIAN ASSISTANT: Julien Girt, NP  ASSISTANTS: Poteat, RN   ANESTHESIA:   general  EBL:  1100 mL   BLOOD ADMINISTERED:none  DRAINS: none   LOCAL MEDICATIONS USED:  MARCAINE    and LIDOCAINE   SPECIMEN:  No Specimen  DISPOSITION OF SPECIMEN:  N/A  COUNTS:  YES  TOURNIQUET:  * No tourniquets in log *  DICTATION: Patient is 65 year old man with prior decompression at L 2 - S 1 with severe recurrent lumbar stenosis and loosening of screws at L 23 and retropulsion  of PLIF cage at L 23 with nerve root compression and recurrent spinal stenosis. It was elected to take him to surgery for redo decompression and  fusion at the L 23 and extension to  L 1 with revision of rods and posterolateral arthrodesis.   Procedure: Patient was placed in a prone position on the Sterling table after smooth and uncomplicated induction of general endotracheal anesthesia. His low back was prepped and draped in usual sterile fashion with betadine scrub and DuraPrep. Area of incision was infiltrated with local lidocaine. Incision was made to the lumbodorsal fascia was incised and exposure was performed of the L 2  - S 1 transverse processes and facet joints. Previously placed spinal hardware and caps were exposed. Hardware appeared well positioned without loosening, except for the L 23 screws.  Left was looser than right.  Both of these screws were exchanged for 55 x 7.5 mm screws with good bone purchase.  L 1 was exposed bilaterally and great care was taken to not disturb the spinal cord stimulator wires, which entered the spinal canal at this level.  There was dense scar tissue along thecal sac and at the L 23 interspace.  We very carefully exposed the cages at this level with the use of the operating microscope. We were able to expose the cages on both sides, but this was difficult and we were certainly unable to remove the cages and exchange them for larger implants.  We were able to tamp the cages back into the interspace and away from the thecal sac and nerve roots and positioning was confirmed with C arm.  We created a CSF leak on the left and used Dural sealant at the end of the case overlying this dural defect, which was ventral to the thecal sac nad was not suturable.  We placed 6.5 x 55 mm screws at L 1 bilaterally and then fashioned rods from L 1 - S 1 bilaterally.  We compressed the construct at L 23 and L 12 levels.  Intraoperative fluoroscopy confirmed correct orientationin the AP and lateral plane.  Final x-rays demonstrated well-positioned pedicle screw fixation.  Posterolateral region was decorticated and packed with small BMP, local autograft, and 100 cc of allograft chips. Long-acting Marcaine was injected in the soft tissues.  Fascia was closed with 1 Vicryl sutures skin edges were reapproximated 2  Vicryl sutures and the skin edges were reapproximated with 3-0 Nylon stitch. The wound is dressed with an occlusive dressing.  The patient was extubated in the operating room and taken to recovery in stable satisfactory condition. Counts were correct at the end of the case.   PLAN OF CARE: Admit to  inpatient   PATIENT DISPOSITION:  PACU - hemodynamically stable.   Delay start of Pharmacological VTE agent (>24hrs) due to surgical blood loss or risk of bleeding: yes  

## 2020-08-09 NOTE — OR Nursing (Signed)
29 mm condom catheter applied by T. Boykins RN @0745  per patient request, MD aware.

## 2020-08-09 NOTE — Progress Notes (Signed)
Pt arrived to unit from PACU. VS stable. Pt DTV by 5300. Call bell in reach bed in lowest position. Pt and wife instructed Pt is to lay flat bedrest. Wife at bedside.    Belongings at bed side from PACU include:  Glasses  Clothing  Back brace  Wife brought: cell phone and charger

## 2020-08-09 NOTE — Op Note (Signed)
08/09/2020  1:28 PM  PATIENT:  Thomas Savage  65 y.o. male  PRE-OPERATIVE DIAGNOSIS:  Lumbar pseudoarthrosis with retropulsed PLIF cages at L 23 and loosening of hardware at L 23 level with radiculopathy and lumbago  POST-OPERATIVE DIAGNOSIS:  Lumbar pseudoarthrosis with retropulsed PLIF cages at L 23 and loosening of hardware at L 23 level with radiculopathy and lumbago  PROCEDURE:  Procedure(s): Revision of hardware at Lumbar one, lumbar two, lumbar three, lumbar four, lumbar five and scaral one. Redo posterior lumbar interbody fusion at lumbar two and lumbar three. pedical screws at lumbat one, lumbar two, lumbar three, lumbar four, lumbar five and scaral one. Posterolateral arthrodesis. (N/A)  SURGEON:  Surgeon(s) and Role:    * Maeola Harman, MD - Primary    * Dawley, Alan Mulder, DO - Assisting  PHYSICIAN ASSISTANT: Julien Girt, NP  ASSISTANTS: Poteat, RN   ANESTHESIA:   general  EBL:  1100 mL   BLOOD ADMINISTERED:none  DRAINS: none   LOCAL MEDICATIONS USED:  MARCAINE    and LIDOCAINE   SPECIMEN:  No Specimen  DISPOSITION OF SPECIMEN:  N/A  COUNTS:  YES  TOURNIQUET:  * No tourniquets in log *  DICTATION: Patient is 65 year old man with prior decompression at L 2 - S 1 with severe recurrent lumbar stenosis and loosening of screws at L 23 and retropulsion  of PLIF cage at L 23 with nerve root compression and recurrent spinal stenosis. It was elected to take him to surgery for redo decompression and  fusion at the L 23 and extension to  L 1 with revision of rods and posterolateral arthrodesis.   Procedure: Patient was placed in a prone position on the Sterling table after smooth and uncomplicated induction of general endotracheal anesthesia. His low back was prepped and draped in usual sterile fashion with betadine scrub and DuraPrep. Area of incision was infiltrated with local lidocaine. Incision was made to the lumbodorsal fascia was incised and exposure was performed of the L 2  - S 1 transverse processes and facet joints. Previously placed spinal hardware and caps were exposed. Hardware appeared well positioned without loosening, except for the L 23 screws.  Left was looser than right.  Both of these screws were exchanged for 55 x 7.5 mm screws with good bone purchase.  L 1 was exposed bilaterally and great care was taken to not disturb the spinal cord stimulator wires, which entered the spinal canal at this level.  There was dense scar tissue along thecal sac and at the L 23 interspace.  We very carefully exposed the cages at this level with the use of the operating microscope. We were able to expose the cages on both sides, but this was difficult and we were certainly unable to remove the cages and exchange them for larger implants.  We were able to tamp the cages back into the interspace and away from the thecal sac and nerve roots and positioning was confirmed with C arm.  We created a CSF leak on the left and used Dural sealant at the end of the case overlying this dural defect, which was ventral to the thecal sac nad was not suturable.  We placed 6.5 x 55 mm screws at L 1 bilaterally and then fashioned rods from L 1 - S 1 bilaterally.  We compressed the construct at L 23 and L 12 levels.  Intraoperative fluoroscopy confirmed correct orientationin the AP and lateral plane.  Final x-rays demonstrated well-positioned pedicle screw fixation.  Posterolateral region was decorticated and packed with small BMP, local autograft, and 100 cc of allograft chips. Long-acting Marcaine was injected in the soft tissues.  Fascia was closed with 1 Vicryl sutures skin edges were reapproximated 2  Vicryl sutures and the skin edges were reapproximated with 3-0 Nylon stitch. The wound is dressed with an occlusive dressing.  The patient was extubated in the operating room and taken to recovery in stable satisfactory condition. Counts were correct at the end of the case.   PLAN OF CARE: Admit to  inpatient   PATIENT DISPOSITION:  PACU - hemodynamically stable.   Delay start of Pharmacological VTE agent (>24hrs) due to surgical blood loss or risk of bleeding: yes

## 2020-08-10 MED ORDER — PANTOPRAZOLE SODIUM 40 MG PO TBEC
40.0000 mg | DELAYED_RELEASE_TABLET | Freq: Every day | ORAL | Status: DC
  Administered 2020-08-10 – 2020-08-13 (×4): 40 mg via ORAL
  Filled 2020-08-10 (×4): qty 1

## 2020-08-10 MED ORDER — TRAZODONE HCL 100 MG PO TABS
100.0000 mg | ORAL_TABLET | Freq: Every day | ORAL | Status: DC
  Administered 2020-08-10 – 2020-08-13 (×4): 100 mg via ORAL
  Filled 2020-08-10 (×4): qty 1

## 2020-08-10 MED ORDER — ENOXAPARIN SODIUM 40 MG/0.4ML ~~LOC~~ SOLN
40.0000 mg | SUBCUTANEOUS | Status: DC
  Administered 2020-08-10 – 2020-08-14 (×5): 40 mg via SUBCUTANEOUS
  Filled 2020-08-10 (×5): qty 0.4

## 2020-08-10 NOTE — Progress Notes (Signed)
PT Cancellation Note  Patient Details Name: Thomas Savage MRN: [id number] DOB: June 11, 1956   Cancelled Treatment:    Reason Eval/Treat Not Completed: Medical issues which prohibited therapy.  Per RN, pt is to remain on flat bedrest until at least Friday.  Will check on Friday to see if pt appropriate for evaluation. 08/10/2020  Jacinto Halim., PT Acute Rehabilitation Services [phone]  (pager) [phone]  (office)   Eliseo Gum Bueford Arp 08/10/2020, 6:24 PM

## 2020-08-10 NOTE — Progress Notes (Signed)
OT Cancellation Note  Patient Details Name: Thomas Savage MRN: [id number] DOB: 20-Apr-1956   Cancelled Treatment:    Reason Eval/Treat Not Completed: Medical issues which prohibited therapy (flat bedrest until friday 08/12/20)  Wynona Neat, OTR/L  Acute Rehabilitation Services Pager: [phone] Office: [phone] .  08/10/2020, 2:28 PM

## 2020-08-10 NOTE — Progress Notes (Signed)
Dr. Maisie Fus returned call about patients temp and concerns, advised to monitor patient status overnight and encourage fluids

## 2020-08-10 NOTE — Progress Notes (Signed)
Dr. Venetia Maxon answering service was notified of patients temp. and concerns.

## 2020-08-10 NOTE — Progress Notes (Signed)
Subjective: Patient reports incisional soreness and an occasional sharp shooting pain in his BLE when he is laying supine.   Objective: Vital signs in last 24 hours: Temp:  [96 F (35.6 C)-100.9 F (38.3 C)] 100.9 F (38.3 C) (01/26 0852) Pulse Rate:  [68-92] 88 (01/26 0852) Resp:  [7-20] 20 (01/26 0852) BP: (99-128)/(44-83) 116/70 (01/26 0852) SpO2:  [97 %-100 %] 100 % (01/26 0852)  Intake/Output from previous day: 01/25 0701 - 01/26 0700 In: 1976.6 [I.V.:1226.6; IV Piggyback:750] Out: 1350 [Urine:250; Blood:1100] Intake/Output this shift: No intake/output data recorded.  Physical Exam: Patient is awake, A/O X 4, conversant, and in good spirits. Speech is fluent and appropriate. Doing well. MAEW with good strength that is symmetric bilaterally. 5/5 BUE/BLE. Dressing is clean dry intact. Incision is well approximated with no drainage, erythema, or edema.   Lab Results: Recent Labs    08/09/20 0748  WBC 8.2  HGB 13.4  HCT 43.0  PLT 184   BMET Recent Labs    08/09/20 0748  NA 137  K 3.9  CL 100  CO2 25  GLUCOSE 98  BUN 15  CREATININE 1.03  CALCIUM 8.9    Studies/Results: DG Lumbar Spine 2-3 Views  Result Date: 08/09/2020 CLINICAL DATA:  Surgery, elective. Additional history provided: Exploration of lumbar 2-3 fusion with possible revision of lumbar 2 to sacral 1 posterior hardware, possible extension of fusion to lumbar 1. Provided fluoroscopy time 59 seconds (54.71 mGy). EXAM: LUMBAR SPINE - 2-3 VIEW; DG C-ARM 1-60 MIN COMPARISON:  Lumbar CT myelogram 07/27/2020. FINDINGS: PA and lateral view intraoperative fluoroscopic images of the lumbar spine are submitted, 3 images total. The images demonstrate a posterior spinal fusion construct (bilateral pedicle screws and vertical interconnecting rods) spanning what are likely the L1-S1 levels (the exact levels are difficult to ascertain given the field of view and the S1 level is largely excluded). Correlate with procedural  history. Interbody devices are present at what appear to be the L2-L3 and L3-L4 levels. Overlying retractors. Spinal stimulator leads project in the region of the thoracic spinal canal. IMPRESSION: Three intraoperative fluoroscopic images of the of the thoracolumbar spine, as described. Electronically Signed   By: Jackey Loge DO   On: 08/09/2020 12:44   DG C-Arm 1-60 Min  Result Date: 08/09/2020 CLINICAL DATA:  Surgery, elective. Additional history provided: Exploration of lumbar 2-3 fusion with possible revision of lumbar 2 to sacral 1 posterior hardware, possible extension of fusion to lumbar 1. Provided fluoroscopy time 59 seconds (54.71 mGy). EXAM: LUMBAR SPINE - 2-3 VIEW; DG C-ARM 1-60 MIN COMPARISON:  Lumbar CT myelogram 07/27/2020. FINDINGS: PA and lateral view intraoperative fluoroscopic images of the lumbar spine are submitted, 3 images total. The images demonstrate a posterior spinal fusion construct (bilateral pedicle screws and vertical interconnecting rods) spanning what are likely the L1-S1 levels (the exact levels are difficult to ascertain given the field of view and the S1 level is largely excluded). Correlate with procedural history. Interbody devices are present at what appear to be the L2-L3 and L3-L4 levels. Overlying retractors. Spinal stimulator leads project in the region of the thoracic spinal canal. IMPRESSION: Three intraoperative fluoroscopic images of the of the thoracolumbar spine, as described. Electronically Signed   By: Jackey Loge DO   On: 08/09/2020 12:44    Assessment/Plan: 65 year old male who is postop day 1 S/P revision of hardware at L1, L2, L3, L4, L5, and S1, redo posterior lumbar interbody fusion at L2/3. The patient is doing well  and reposts a significant improvement in his preoperative symptoms. Has complaints of mild incisional discomfort that is well controlled on PO analgesics. Does note some intermittent sharp pains into his BLE with laying supine. Continue  pain control and bowel regimen. Patient to remain on strict bedrest with HOB flat X 3 days. PT/OT when bedrest is discontinued. LSO brace when OOB.    LOS: 1 day     Council Mechanic, DNP, NP-C 08/10/2020, 9:09 AM

## 2020-08-11 ENCOUNTER — Ambulatory Visit: Payer: Medicare Other | Admitting: Physical Therapy

## 2020-08-11 LAB — POCT I-STAT EG7
Acid-Base Excess: 0 mmol/L (ref 0.0–2.0)
Bicarbonate: 26.2 mmol/L (ref 20.0–28.0)
Calcium, Ion: 1.16 mmol/L (ref 1.15–1.40)
HCT: 36 % — ABNORMAL LOW (ref 39.0–52.0)
Hemoglobin: 12.2 g/dL — ABNORMAL LOW (ref 13.0–17.0)
O2 Saturation: 98 %
Patient temperature: 35.4
Potassium: 4 mmol/L (ref 3.5–5.1)
Sodium: 139 mmol/L (ref 135–145)
TCO2: 28 mmol/L (ref 22–32)
pCO2, Ven: 44.5 mmHg (ref 44.0–60.0)
pH, Ven: 7.371 (ref 7.250–7.430)
pO2, Ven: 94 mmHg — ABNORMAL HIGH (ref 32.0–45.0)

## 2020-08-11 NOTE — Progress Notes (Signed)
Subjective: Patient reports that he is doing well overall. He continues to have mild back soreness and an occasional electrical shock type feeling that radiates into his BLE while laying supine. No acute events overnight.   Objective: Vital signs in last 24 hours: Temp:  [98.6 F (37 C)-100.9 F (38.3 C)] 98.6 F (37 C) (01/27 0441) Pulse Rate:  [87-96] 96 (01/27 0441) Resp:  [16-20] 18 (01/26 2357) BP: (95-124)/(61-70) 124/70 (01/27 0344) SpO2:  [92 %-100 %] 93 % (01/27 0441)  Intake/Output from previous day: 01/26 0701 - 01/27 0700 In: 900 [I.V.:900] Out: 1250 [Urine:1250] Intake/Output this shift: No intake/output data recorded.   Physical Exam: Patient is awake, A/O X 4, conversant, and in good spirits. He is laying on his side with HOB flat. Speech is fluent and appropriate. Doing well. MAEW with good strength that is symmetric bilaterally. 5/5 BUE/BLE. Dressing with old drainage. Incision is well approximated.  Lab Results: Recent Labs    08/09/20 0748  WBC 8.2  HGB 13.4  HCT 43.0  PLT 184   BMET Recent Labs    08/09/20 0748  NA 137  K 3.9  CL 100  CO2 25  GLUCOSE 98  BUN 15  CREATININE 1.03  CALCIUM 8.9    Studies/Results: DG Lumbar Spine 2-3 Views  Result Date: 08/09/2020 CLINICAL DATA:  Surgery, elective. Additional history provided: Exploration of lumbar 2-3 fusion with possible revision of lumbar 2 to sacral 1 posterior hardware, possible extension of fusion to lumbar 1. Provided fluoroscopy time 59 seconds (54.71 mGy). EXAM: LUMBAR SPINE - 2-3 VIEW; DG C-ARM 1-60 MIN COMPARISON:  Lumbar CT myelogram 07/27/2020. FINDINGS: PA and lateral view intraoperative fluoroscopic images of the lumbar spine are submitted, 3 images total. The images demonstrate a posterior spinal fusion construct (bilateral pedicle screws and vertical interconnecting rods) spanning what are likely the L1-S1 levels (the exact levels are difficult to ascertain given the field of view and  the S1 level is largely excluded). Correlate with procedural history. Interbody devices are present at what appear to be the L2-L3 and L3-L4 levels. Overlying retractors. Spinal stimulator leads project in the region of the thoracic spinal canal. IMPRESSION: Three intraoperative fluoroscopic images of the of the thoracolumbar spine, as described. Electronically Signed   By: Jackey Loge DO   On: 08/09/2020 12:44   DG C-Arm 1-60 Min  Result Date: 08/09/2020 CLINICAL DATA:  Surgery, elective. Additional history provided: Exploration of lumbar 2-3 fusion with possible revision of lumbar 2 to sacral 1 posterior hardware, possible extension of fusion to lumbar 1. Provided fluoroscopy time 59 seconds (54.71 mGy). EXAM: LUMBAR SPINE - 2-3 VIEW; DG C-ARM 1-60 MIN COMPARISON:  Lumbar CT myelogram 07/27/2020. FINDINGS: PA and lateral view intraoperative fluoroscopic images of the lumbar spine are submitted, 3 images total. The images demonstrate a posterior spinal fusion construct (bilateral pedicle screws and vertical interconnecting rods) spanning what are likely the L1-S1 levels (the exact levels are difficult to ascertain given the field of view and the S1 level is largely excluded). Correlate with procedural history. Interbody devices are present at what appear to be the L2-L3 and L3-L4 levels. Overlying retractors. Spinal stimulator leads project in the region of the thoracic spinal canal. IMPRESSION: Three intraoperative fluoroscopic images of the of the thoracolumbar spine, as described. Electronically Signed   By: Jackey Loge DO   On: 08/09/2020 12:44    Assessment/Plan: 65 year old male who is postop day 2 S/P revision of hardware at L1, L2, L3, L4,  L5, and S1, redo posterior lumbar interbody fusion at L2/3. The patient continues to do well. He continues to experience mild incisional discomfort that is well controlled on PO analgesics and some intermittent sharp pains into his BLE with laying supine.  Continue pain control and bowel regimen. Patient to remain on strict bedrest with HOB flat X 2 days. PT/OT when bedrest is discontinued. LSO brace when OOB.   LOS: 2 days     Council Mechanic, DNP, NP-C 08/11/2020, 7:34 AM

## 2020-08-11 NOTE — Progress Notes (Signed)
Patient afebrile at 8pm and 12am vital sign checks but had temp of 100.6 at 3:44am, tylenol given and temp 98.6 when rechecked.  Pulse in low 90s and O2 in mid 90s at each VS check but 8pm and 12am pulse and O2 data did not validate into flowsheets.  O2 at 3:44am in low 90s, patient educated on incentive spirometer usage including frequency per hour and importance as well as encouraging patient to drink fluids.  Patient verbalized understanding. Trazodone did not help patient sleep but when given PRN Ambien patient was able to sleep

## 2020-08-11 NOTE — Plan of Care (Signed)

## 2020-08-12 MED ORDER — SALINE SPRAY 0.65 % NA SOLN
1.0000 | NASAL | Status: DC | PRN
  Administered 2020-08-12: 1 via NASAL
  Filled 2020-08-12: qty 44

## 2020-08-12 MED ORDER — GABAPENTIN 300 MG PO CAPS
300.0000 mg | ORAL_CAPSULE | Freq: Three times a day (TID) | ORAL | Status: DC
  Administered 2020-08-12 – 2020-08-14 (×8): 300 mg via ORAL
  Filled 2020-08-12 (×8): qty 1

## 2020-08-12 NOTE — Plan of Care (Signed)

## 2020-08-12 NOTE — Progress Notes (Signed)
At 1600, HOB elevated 30 degrees.  Patient tolerated that well without any dizziness or other symptoms.  At 1700, patient sat up on edge of bed.  Patient initially "a little dizzy" but that subsided after sitting for a Carr time.  At 1730, ambulated patient in room with walker.  Patient steady with walker and denied dizziness, just stated that his legs are still weak.  Patient now sitting up in chair for dinner.  NAD.  Wife at bedside.

## 2020-08-12 NOTE — Progress Notes (Signed)
OT Cancellation Note  Patient Details Name: BODE PIEPER MRN: [id number] DOB: Jun 09, 1956   Cancelled Treatment:    Reason Eval/Treat Not Completed:  (Pt on flat bedrest until 4 pm. Will follow.)  Evern Bio 08/12/2020, 11:08 AM  Martie Round, OTR/L Acute Rehabilitation Services Pager: [phone] Office: [phone]

## 2020-08-12 NOTE — Discharge Summary (Signed)
Physician Discharge Summary  Patient ID: Thomas Savage MRN: [id number] DOB/AGE: Jun 17, 1956 65 y.o.  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 The patient tolerated the procedure well and was taken to the recovery room and then to the floor in stable condition. The hospital course was routine. There were no complications. The wound remained clean dry and intact. Pt had appropriate back soreness. No complaints of new leg pain or N/T/W. The patient remained afebrile with stable vital signs, and tolerated a regular diet. The patient continued to increase activities, and pain was well controlled with oral pain medications. PT recommended home PT/OT and the patient was discharged home on 08/14/20.  Consults: None  Significant Diagnostic Studies: radiology: X-Ray  Treatments: surgery: Revision of hardware at Lumbar one, lumbar two, lumbar three, lumbar four, lumbar five and scaral one. Redo posterior lumbar interbody fusion at lumbar two and lumbar three. pedical screws at lumbat one, lumbar two, lumbar three, lumbar four, lumbar five and scaral one. Posterolateral arthrodesis. (N/A)  Discharge Exam: Blood pressure (!) 98/53, pulse 88, temperature 99.1 F (37.3 C), resp. rate 18, height 6\' 2"  (1.88 m), weight 131.5 kg, SpO2 98 %.  Physical Exam: Patient is awake, A/O X 4, conversant, and in good spirits.He is laying on his side  with HOB flat.Speech is fluent and appropriate. Doing well. MAEW with good strength that is symmetric bilaterally. 5/5 BUE/BLE.Dressing with old drainage. Incision is well approximated.  Disposition: Home / Self care   Gabapentin and tizanidine have been sent into the patient's pharmacy     Allergies as of 08/14/2020      Reactions   Morphine And Related Rash   IV formulation ONLY      Medication List    STOP taking these medications   ibuprofen 400 MG tablet Commonly known as: ADVIL   traMADol 50 MG tablet Commonly known as: ULTRAM     TAKE these medications   acetaminophen 500 MG tablet Commonly known as: TYLENOL Take 1,000 mg by mouth every 8 (eight) hours as needed for moderate pain.   docusate sodium 100 MG capsule Commonly known as: COLACE Take 100 mg by mouth daily.   fluticasone 50 MCG/ACT nasal spray Commonly known as: FLONASE Place 1 spray into both nostrils daily.   losartan-hydrochlorothiazide 50-12.5 MG tablet Commonly known as: HYZAAR Take 1 tablet by mouth every evening.   methocarbamol 750 MG tablet Commonly known as: ROBAXIN Take 750 mg by mouth every 4 (four) hours as needed for muscle spasms.   Oxycodone HCl 10 MG Tabs Take 1 tablet (10 mg total) by mouth every 4 (four) hours as needed for severe pain ((score 7 to 10)).   vitamin B-12 1000 MCG tablet Commonly known as: CYANOCOBALAMIN Take 1,000 mcg by mouth every evening.       Signed: 08/16/2020, DNP, NP-C 08/12/2020, 10:54 AM

## 2020-08-12 NOTE — Progress Notes (Signed)
Subjective: Patient reports that he is doing well. He continues to have complaints of intermittent BLE pain that typically occurs with movement, RLE stated to be worse the LLE. No acute events overnight.   Objective: Vital signs in last 24 hours: Temp:  [98.4 F (36.9 C)-100.4 F (38 C)] 98.8 F (37.1 C) (01/28 0345) Pulse Rate:  [82-95] 84 (01/28 0345) Resp:  [18] 18 (01/27 2003) BP: (93-136)/(47-79) 117/79 (01/28 0345) SpO2:  [91 %-98 %] 98 % (01/28 0345)  Intake/Output from previous day: 01/27 0701 - 01/28 0700 In: 2240.4 [P.O.:480; I.V.:1760.4] Out: 2000 [Urine:2000] Intake/Output this shift: No intake/output data recorded.  Physical Exam: Patient is awake, A/O X 4, conversant, and in good spirits. He is laying on his side with HOB flat. Speech is fluent and appropriate. Doing well. MAEW with good strength that is symmetric bilaterally. 5/5 BUE/BLE. Dressing with old drainage. Incision is well approximated.  Lab Results: Recent Labs    08/09/20 0748 08/09/20 1116  WBC 8.2  --   HGB 13.4 12.2*  HCT 43.0 36.0*  PLT 184  --    BMET Recent Labs    08/09/20 0748 08/09/20 1116  NA 137 139  K 3.9 4.0  CL 100  --   CO2 25  --   GLUCOSE 98  --   BUN 15  --   CREATININE 1.03  --   CALCIUM 8.9  --     Studies/Results: No results found.  Assessment/Plan: 65 year old male who is postop day 2 S/P revision of hardware at L1, L2, L3, L4, L5, and S1, redo posterior lumbar interbody fusion at L2/3. The patient continues to do recovering well. He continues to experience mild incisional discomfort that is well controlled with PO analgesics and some intermittent sharp pains into his BLE with position changes, Right worse than Left. Continue pain control and bowel regimen. Will start gabapentin today to help with the BLE pain. Patient to remain on strict bedrest with HOB flat until 4 pm today. At 4 pm, place the patient's HOB at 30 degrees for a few hours. As long as patient remains  stable with his HOB elevated, can proceed with progressing mobility. Plan for PT/OT tomorrow if patient remains stable. LSO brace when OOB. Wil likely be ready for discharge this weekend.   LOS: 3 days     Council Mechanic, DNP, NP-C 08/12/2020, 7:34 AM

## 2020-08-12 NOTE — Progress Notes (Signed)
PT Cancellation Note  Patient Details Name: Thomas Savage MRN: [id number] DOB: Dec 08, 1955   Cancelled Treatment:    Reason Eval/Treat Not Completed: Active bedrest order. Per neurosurgery note "Patient to remain on strict bedrest with HOB flat until 4 pm today. At 4 pm, place the patient's HOB at 30 degrees for a few hours. As long as patient remains stable with his HOB elevated, can proceed with progressing mobility. Plan for PT/OT tomorrow if patient remains stable". PT will follow up tomorrow.   Arlyss Gandy 08/12/2020, 3:42 PM

## 2020-08-13 NOTE — Progress Notes (Signed)
Neurosurgery Service Progress Note  Subjective: No acute events overnight, leg and back pain improving, no new complaints   Objective: Vitals:   08/12/20 2029 08/12/20 2327 08/13/20 0304 08/13/20 0808  BP: 101/70 110/66 121/77 116/72  Pulse: 86 82 79 84  Resp: 18 17 18 18   Temp: 99.3 F (37.4 C) 99 F (37.2 C) 98.8 F (37.1 C) 98.6 F (37 C)  TempSrc:      SpO2: 96% 99% 100% 97%  Weight:      Height:        Physical Exam: Up and walking with PT, gait deliberate and requires assistance especially with balance but grossly full strength, incision c/d/i  Assessment & Plan: 65 y.o. man s/p redo L1-S1 lami fusion, recovering well.  -PT/OT recs -possible discharge tomorrow -SCDs/TEDs/enoxaparin PPx  76  08/13/20 12:17 PM

## 2020-08-13 NOTE — Evaluation (Signed)
Occupational Therapy Evaluation Patient Details Name: Thomas Savage MRN: [id number] DOB: [birth year] Today's Date: 08/13/2020    History of Present Illness 65 yo male S/P revision of hardware at L1, L2, L3, L4, L5, and S1, redo posterior lumbar interbody fusion at L2/3.  PMH PLIF L2-4 05/25/20 spinal stimular HTN idiopathic neuropathy   Clinical Impression   Pt PTA: Pt reports independence with ADL and mobility at home- occasional use of SPC for stability on uneven ground. Pt currently, limited by pain- no LOB episodes with RW in room. Pt supervisionA for bed mobility and using HOB elevated to get OOB. Pt transferring to recliner with supervisionA and standing at sink for light grooming with 1UE supported for stability. Pt's RLE, buttocks and back continue to have pain, 7/10 with mobility. Pt has reacher for ADL/iADL assist and OTR describing AE  For LB ADL assist. Pt able to perform figure 4 technique for task LB dressing.  Back handout provided and reviewed adls in detail. Pt educated on: clothing between brace, never sleep in brace, set an alarm at night for medication, avoid sitting for long periods of time, correct bed positioning for sleeping, correct sequence for bed mobility, avoiding lifting more than 5 pounds and never wash directly over incision. All education is complete and patient indicates understanding. Pt felt like he had "been through this 4 times so I know what I am doing. You are welcome to keep coming, but I think I have got it." Pt with good safety awareness. OT signing off. Thank you for this referral.      Follow Up Recommendations  No OT follow up;Supervision - Intermittent    Equipment Recommendations  3 in 1 bedside commode    Recommendations for Other Services       Precautions / Restrictions Precautions Precautions: Back Precaution Comments: pt stating3/3 and OTR adding no arching. Required Braces or Orthoses: Spinal Brace Spinal Brace: Lumbar corset;Applied  in sitting position Restrictions Weight Bearing Restrictions: No      Mobility Bed Mobility Overal bed mobility: Needs Assistance Bed Mobility: Rolling;Sidelying to Sit Rolling: Supervision Sidelying to sit: Supervision       General bed mobility comments: Pt using elevating HOB portion to 30*    Transfers Overall transfer level: Needs assistance Equipment used: Rolling walker (2 wheeled) Transfers: Sit to/from [employer] Transfers Sit to Stand: Supervision;From elevated surface Stand pivot transfers: Supervision       General transfer comment: bed elevated    Balance Overall balance assessment: Mild deficits observed, not formally tested                                         ADL either performed or assessed with clinical judgement   ADL Overall ADL's : Modified independent;At baseline                                       General ADL Comments: Pt reporting that this is his  4th back sx and he has been through this a lot since 2018 so he reports familarity with back precautions- able to state 3/3 and able to perform LB dressing with no physical assist with figure 4 technique. Pt perfoming transfers and mobility in room with supervisionA to minguardA for mobility with RW. Pt with no LOB episodes- reminders  at sink to leave single UE on counter for stability. Pt reports that he will perform sink bath at home while his bathroom is being remodeled from a tub shower and standard commode to a walk-in shower and high commode.     Vision Baseline Vision/History: Wears glasses Wears Glasses: At all times Patient Visual Report: No change from baseline Vision Assessment?: No apparent visual deficits     Perception     Praxis      Pertinent Vitals/Pain Pain Assessment: 0-10 Pain Score: 6  Pain Location: back, buttocks, R LE Pain Descriptors / Indicators: Discomfort Pain Intervention(s): Limited activity within patient's tolerance      Hand Dominance Right   Extremity/Trunk Assessment Upper Extremity Assessment Upper Extremity Assessment: Overall WFL for tasks assessed   Lower Extremity Assessment Lower Extremity Assessment: Overall WFL for tasks assessed   Cervical / Trunk Assessment Cervical / Trunk Assessment: Other exceptions Cervical / Trunk Exceptions: s/p  L-S1 sx   Communication Communication Communication: No difficulties   Cognition Arousal/Alertness: Awake/alert Behavior During Therapy: WFL for tasks assessed/performed Overall Cognitive Status: Within Functional Limits for tasks assessed                                     General Comments  Pt familiar with back precautions- handout provided. Pt describing that he will take a sink bath or have assist from spouse getting in and out of tub.    Exercises     Shoulder Instructions      Home Living Family/patient expects to be discharged to:: Private residence Living Arrangements: Spouse/significant other Available Help at Discharge: Family;Available 24 hours/day Type of Home: House Home Access: Stairs to enter [employer] of Steps: 3 Entrance Stairs-Rails: Right Home Layout: Two level;Able to live on main level with bedroom/bathroom     Bathroom Shower/Tub: Chief Strategy Officer: Standard Bathroom Accessibility: Yes   Home Equipment: Walker - 2 wheels;Tub bench;Cane - single point;Adaptive equipment;Hand held shower head Adaptive Equipment: Reacher;Long-handled sponge        Prior Functioning/Environment Level of Independence: Independent        Comments: Carried a SPC for stabilization for uneven surfaces        OT Problem List: Decreased activity tolerance;Pain      OT Treatment/Interventions:      OT Goals(Current goals can be found in the care plan section) Acute Rehab OT Goals Patient Stated Goal: to go home in a few days OT Goal Formulation: All assessment and education  complete, DC therapy Potential to Achieve Goals: Good  OT Frequency:     Barriers to D/C:            Co-evaluation              AM-PAC OT "6 Clicks" Daily Activity     Outcome Measure Help from another person eating meals?: None Help from another person taking care of personal grooming?: A Little Help from another person toileting, which includes using toliet, bedpan, or urinal?: A Little Help from another person bathing (including washing, rinsing, drying)?: A Little Help from another person to put on and taking off regular upper body clothing?: None Help from another person to put on and taking off regular lower body clothing?: A Little 6 Click Score: 20   End of Session Equipment Utilized During Treatment: Gait belt;Rolling walker Nurse Communication: Mobility status;Other (comment) (IV came out- RN  aware)  Activity Tolerance: Patient tolerated treatment well;Patient limited by pain Patient left: in chair;with call bell/phone within reach;Other (comment) (pt's IV came out and RN aware)  OT Visit Diagnosis: Unsteadiness on feet (R26.81);Pain Pain - Right/Left: Right Pain - part of body: Leg (back, buttocks)                Time: 2122-4825 OT Time Calculation (min): 40 min Charges:  OT General Charges $OT Visit: 1 Visit OT Evaluation $OT Eval Moderate Complexity: 1 Mod OT Treatments $Self Care/Home Management : 8-22 mins $Therapeutic Activity: 8-22 mins  Flora Lipps, OTR/L Acute Rehabilitation Services Pager: [phone] Office: [phone]   Vallerie Hentz C 08/13/2020, 10:29 AM

## 2020-08-13 NOTE — Evaluation (Signed)
Physical Therapy Evaluation Patient Details Name: Thomas Savage MRN: [id number] DOB: 01-03-1956 Today's Date: 08/13/2020   History of Present Illness  65 yo male S/P revision of hardware at L1, L2, L3, L4, L5, and S1, redo posterior lumbar interbody fusion at L2/3.  PMH PLIF L2-4 05/25/20 spinal stimular HTN idiopathic neuropathy  Clinical Impression  Pt presents to PT with deficits in functional mobility, gait, balance, endurance, strength, power. Pt is mobilizing well with UE support of RW but does demonstrate BLE weakness. Pt does experience one posterior loss of balance when attempting to don mask in standing without UE support. PT provides reinforcement of the need for BUE support during all standing activity. PT also recommends assistance for all OOB mobility at this time. Per pt neurosurgery wants pt to begin follow-up therapies after follow-up in clinic as an outpatient.    Follow Up Recommendations Outpatient PT;Supervision for mobility/OOB (per pt he prefers Outpatient PT, pt also reporting neurosurgery does not want the pt to initiate PT until after follow-up)    Equipment Recommendations  None recommended by PT    Recommendations for Other Services       Precautions / Restrictions Precautions Precautions: Back Precaution Comments: pt able to recall back precautions from 3 previous back surgeries Required Braces or Orthoses: Spinal Brace Spinal Brace: Lumbar corset (on upon arrival) Restrictions Weight Bearing Restrictions: No      Mobility  Bed Mobility               General bed mobility comments: pt received sitting in recliner, left sitting at edge of bed    Transfers Overall transfer level: Needs assistance Equipment used: Rolling walker (2 wheeled) Transfers: Sit to/from Stand Sit to Stand: Supervision            Ambulation/Gait Ambulation/Gait assistance: Min guard Gait Distance (Feet): 100 Feet Assistive device: Rolling walker (2  wheeled) Gait Pattern/deviations: Step-through pattern Gait velocity: reduced Gait velocity interpretation: <1.8 ft/sec, indicate of risk for recurrent falls General Gait Details: pt with slowed step through gait, reduced stride length  Stairs            Wheelchair Mobility    Modified Rankin (Stroke Patients Only)       Balance Overall balance assessment: Needs assistance Sitting-balance support: Feet supported;No upper extremity supported Sitting balance-Leahy Scale: Good     Standing balance support: Single extremity supported;Bilateral upper extremity supported Standing balance-Leahy Scale: Poor Standing balance comment: pt reliant upon unilateral or bilateral UE support in standing. Pt attempts to remove BUE support from RW to don mask and experiences posterior LOB requiring modA to correct                             Pertinent Vitals/Pain Pain Assessment: Faces Faces Pain Scale: Hurts little more Pain Location: back Pain Descriptors / Indicators: Grimacing Pain Intervention(s): Monitored during session    Home Living Family/patient expects to be discharged to:: Private residence Living Arrangements: Spouse/significant other;Children Available Help at Discharge: Family;Available 24 hours/day Type of Home: House Home Access: Stairs to enter Entrance Stairs-Rails: Right Entrance Stairs-Number of Steps: 3 Home Layout: Two level;Able to live on main level with bedroom/bathroom Home Equipment: Dan Humphreys - 2 wheels;Tub bench;Cane - single point;Adaptive equipment;Hand held shower head      Prior Function Level of Independence: Independent         Comments: Carried a SPC for stabilization for uneven surfaces  Hand Dominance   Dominant Hand: Right    Extremity/Trunk Assessment   Upper Extremity Assessment Upper Extremity Assessment: Overall WFL for tasks assessed    Lower Extremity Assessment Lower Extremity Assessment: RLE  deficits/detail;LLE deficits/detail RLE Deficits / Details: grossly 4/5 BLE LLE Deficits / Details: grossly 4/5 BLE    Cervical / Trunk Assessment Cervical / Trunk Assessment: Other exceptions Cervical / Trunk Exceptions: s/p  L-S1 sx  Communication   Communication: No difficulties  Cognition Arousal/Alertness: Awake/alert Behavior During Therapy: WFL for tasks assessed/performed Overall Cognitive Status: Within Functional Limits for tasks assessed                                        General Comments General comments (skin integrity, edema, etc.): VSS on RA    Exercises     Assessment/Plan    PT Assessment Patient needs continued PT services  PT Problem List Decreased strength;Decreased activity tolerance;Decreased balance;Decreased mobility       PT Treatment Interventions DME instruction;Gait training;Stair training;Functional mobility training;Therapeutic activities;Therapeutic exercise;Balance training;Patient/family education;Neuromuscular re-education    PT Goals (Current goals can be found in the Care Plan section)  Acute Rehab PT Goals Patient Stated Goal: to go home PT Goal Formulation: With patient Time For Goal Achievement: 08/27/20 Potential to Achieve Goals: Good    Frequency Min 5X/week   Barriers to discharge        Co-evaluation               AM-PAC PT "6 Clicks" Mobility  Outcome Measure Help needed turning from your back to your side while in a flat bed without using bedrails?: A Little Help needed moving from lying on your back to sitting on the side of a flat bed without using bedrails?: A Little Help needed moving to and from a bed to a chair (including a wheelchair)?: A Little Help needed standing up from a chair using your arms (e.g., wheelchair or bedside chair)?: A Little Help needed to walk in hospital room?: A Little Help needed climbing 3-5 steps with a railing? : A Lot 6 Click Score: 17    End of Session  Equipment Utilized During Treatment: Gait belt Activity Tolerance: Patient tolerated treatment well Patient left: in bed;with call bell/phone within reach;with bed alarm set Nurse Communication: Mobility status PT Visit Diagnosis: Unsteadiness on feet (R26.81);Other abnormalities of gait and mobility (R26.89);Muscle weakness (generalized) (M62.81)    Time: 2297-9892 PT Time Calculation (min) (ACUTE ONLY): 19 min   Charges:   PT Evaluation $PT Eval Low Complexity: 1 Low          Arlyss Gandy, PT, DPT Acute Rehabilitation Pager: [phone]   Arlyss Gandy 08/13/2020, 1:07 PM

## 2020-08-14 MED ORDER — OXYCODONE HCL 10 MG PO TABS: 10.0000 mg | ORAL_TABLET | ORAL | 0 refills | Status: AC | PRN

## 2020-08-14 NOTE — Progress Notes (Signed)
Physical Therapy Treatment Patient Details Name: Thomas Savage MRN: [id number] DOB: 04/20/1956 Today's Date: 08/14/2020    History of Present Illness 65 yo male S/P revision of hardware at L1, L2, L3, L4, L5, and S1, redo posterior lumbar interbody fusion at L2/3.  PMH PLIF L2-4 05/25/20 spinal stimular HTN idiopathic neuropathy    PT Comments    Patient seen for mobility progression s/p spinal surgery. Mobilizing well. Educated patient on precautions, mobility expectations, safety and car transfers. Current POC remains appropriate.    Follow Up Recommendations  Outpatient PT;Supervision for mobility/OOB (per pt he prefers Outpatient PT, pt also reporting neurosurgery does not want the pt to initiate PT until after follow-up)     Equipment Recommendations  None recommended by PT    Recommendations for Other Services       Precautions / Restrictions Precautions Precautions: Back Precaution Comments: pt able to recall back precautions from 3 previous back surgeries Required Braces or Orthoses: Spinal Brace Spinal Brace: Lumbar corset (on upon arrival)    Mobility  Bed Mobility Overal bed mobility: Needs Assistance Bed Mobility: Rolling;Sidelying to Sit;Sit to Sidelying Rolling: Supervision Sidelying to sit: Supervision     Sit to sidelying: Supervision General bed mobility comments: no physical assist needed at this time  Transfers Overall transfer level: Needs assistance Equipment used: Rolling walker (2 wheeled) Transfers: Sit to/from Stand Sit to Stand: Supervision         General transfer comment: no physical assist needed  Ambulation/Gait Ambulation/Gait assistance: Supervision Gait Distance (Feet): 160 Feet Assistive device: Rolling walker (2 wheeled) Gait Pattern/deviations: Step-through pattern Gait velocity: reduced   General Gait Details: pt with slowed step through gait, reduced stride length   Stairs             Wheelchair Mobility     Modified Rankin (Stroke Patients Only)       Balance Overall balance assessment: Needs assistance Sitting-balance support: Feet supported;No upper extremity supported Sitting balance-Leahy Scale: Good     Standing balance support: Single extremity supported;Bilateral upper extremity supported Standing balance-Leahy Scale: Poor Standing balance comment: pt reliant upon unilateral or bilateral UE support in standing. Pt attempts to remove BUE support from RW to don mask and experiences posterior LOB requiring modA to correct                            Cognition Arousal/Alertness: Awake/alert Behavior During Therapy: WFL for tasks assessed/performed Overall Cognitive Status: Within Functional Limits for tasks assessed                                        Exercises      General Comments        Pertinent Vitals/Pain Faces Pain Scale: Hurts little more Pain Location: back Pain Descriptors / Indicators: Grimacing    Home Living                      Prior Function            PT Goals (current goals can now be found in the care plan section) Acute Rehab PT Goals Patient Stated Goal: to go home PT Goal Formulation: With patient Time For Goal Achievement: 08/27/20 Potential to Achieve Goals: Good Progress towards PT goals: Progressing toward goals    Frequency    Min 5X/week  PT Plan Current plan remains appropriate    Co-evaluation              AM-PAC PT "6 Clicks" Mobility   Outcome Measure  Help needed turning from your back to your side while in a flat bed without using bedrails?: A Little Help needed moving from lying on your back to sitting on the side of a flat bed without using bedrails?: A Little Help needed moving to and from a bed to a chair (including a wheelchair)?: A Little Help needed standing up from a chair using your arms (e.g., wheelchair or bedside chair)?: A Little Help needed to walk in  hospital room?: A Little Help needed climbing 3-5 steps with a railing? : A Lot 6 Click Score: 17    End of Session Equipment Utilized During Treatment: Gait belt Activity Tolerance: Patient tolerated treatment well Patient left: in bed;with call bell/phone within reach;with bed alarm set Nurse Communication: Mobility status PT Visit Diagnosis: Unsteadiness on feet (R26.81);Other abnormalities of gait and mobility (R26.89);Muscle weakness (generalized) (M62.81)     Time: 7741-4239 PT Time Calculation (min) (ACUTE ONLY): 18 min  Charges:  $Gait Training: 8-22 mins                     Charlotte Crumb, PT DPT  Board Certified Neurologic Specialist Acute Rehabilitation Services Office [phone]    Fabio Asa 08/14/2020, 2:07 PM

## 2020-08-14 NOTE — TOC Initial Note (Signed)
Transition of Care [facility]) - Initial/Assessment Note    Patient Details  Name: Thomas Savage MRN: [id number] Date of Birth: [birth year]  [facility] CM/SW Contact:    Thomas Bridgeman, RN Phone Number: 08/14/2020, 1:44 PM  Clinical Narrative:                 Case management called and spoke with the patient on the phone regarding transitions of care to home.  The patient plans to discharge home with his wife today by car.  I spoke with the patient and offered a 3:1 and he politely declined since he had all dme at home for recovery including a RW and 3:1.  The patient has a current PCP  - Thomas Savage in [facility] that is noted in the discharge instructions.  The patient states that he is current with this physican and is already set up with medical follow up on August 23, 2020.  The patient plans to have Thomas Savage office set follow up appointment with outpatient PT clinic that the patient sees on [facility].  The patient states that he will bring the contact number to Thomas Savage for hospital follow up visit.  No other TOC discharge needs for this patient and patient is able to discharge home with family today.  Expected Discharge Plan: OP Rehab Barriers to Discharge: No Barriers Identified   Patient Goals and CMS Choice Patient states their goals for this hospitalization and ongoing recovery are:: Patient plans to discharge home with wife today. CMS Medicare.gov Compare Post Acute Care list provided to:: Patient Choice offered to / list presented to : Patient  Expected Discharge Plan and Services Expected Discharge Plan: OP Rehab   Discharge Planning Services: CM Consult   Living arrangements for the past 2 months: Single Family Home Expected Discharge Date: 08/14/20                                    Prior Living Arrangements/Services Living arrangements for the past 2 months: Single Family Home Lives with:: Spouse Patient language and need  for interpreter reviewed:: Yes Do you feel safe going back to the place where you live?: Yes      Need for Family Participation in Patient Care: Yes (Comment) Care giver support system in place?: Yes (comment) Current home services: DME (Patient has RW and 3:1 at home.  Patient has seen Outpatient PT/OT off [facility]. in the past and plans to follow up after Thomas Savage approved post-surgical PT - not set up by [facility].) Criminal Activity/Legal Involvement Pertinent to Current Situation/Hospitalization: No - Comment as needed  Activities of Daily Living Home Assistive Devices/Equipment: Eyeglasses,Cane (specify quad or straight) ADL Screening (condition at time of admission) Patient's cognitive ability adequate to safely complete daily activities?: Yes Is the patient deaf or have difficulty hearing?: No Does the patient have difficulty seeing, even when wearing glasses/contacts?: No Does the patient have difficulty concentrating, remembering, or making decisions?: No Patient able to express need for assistance with ADLs?: Yes Does the patient have difficulty dressing or bathing?: No Independently performs ADLs?: Yes (appropriate for developmental age) Does the patient have difficulty walking or climbing stairs?: Yes Weakness of Legs: Right Weakness of Arms/Hands: None  Permission Sought/Granted Permission sought to share information with : Case Manager Permission granted to share information with : Yes, Verbal Permission Granted  Permission granted to share info w Relationship: Thomas Savage, spouse - [phone]     Emotional Assessment   Attitude/Demeanor/Rapport: Engaged Affect (typically observed): Accepting Orientation: : Oriented to Self,Oriented to Place,Oriented to  Time,Oriented to Situation Alcohol / Substance Use: Not Applicable Psych Involvement: No (comment)  Admission diagnosis:  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 Subconjunctival hematoma, left 05/29/2020  . Lumbar radiculopathy 05/25/2020  . Benign essential HTN 05/25/2020  . Impaired ambulation 05/24/2020  . Right-sided low back pain with right-sided sciatica    PCP:  Patient, No Pcp Per Pharmacy:   [id number] [place], [place] - [address]  [address] Phone: [phone] Fax: [phone]     Social Determinants of Health (SDOH) Interventions    Readmission Risk Interventions Readmission Risk Prevention Plan 08/14/2020  Post Dischage Appt Complete  Medication Screening Complete  Transportation Screening Complete

## 2020-08-14 NOTE — Progress Notes (Signed)
Neurosurgery Service Progress Note  Subjective: No acute events overnight, continues to improve, got stuck on the toilet due and nervous about going home and getting stuck at home  Objective: Vitals:   08/13/20 2014 08/13/20 2356 08/14/20 0335 08/14/20 0729  BP: 109/70 118/66 122/76 111/61  Pulse: 81 75 76 72  Resp: 18  18 20   Temp: 97.8 F (36.6 C) 98.8 F (37.1 C) 98.7 F (37.1 C) (!) 97.3 F (36.3 C)  TempSrc: Oral Oral Oral   SpO2: 97% 100% 99% 100%  Weight:      Height:        Physical Exam: Up and walking with PT, gait deliberate and requires assistance especially with balance but grossly full strength, incision c/d/i  Assessment & Plan: 65 y.o. man s/p redo L1-S1 lami fusion, recovering well.  -PT/OT recs / transfer training -possible discharge today versus tomorrow if he feels comfortable after training -SCDs/TEDs/enoxaparin PPx  76  08/14/20 10:10 AM

## 2020-08-14 NOTE — Progress Notes (Signed)
Pt seen by Dr. Maurice Small this am and planned for discharge depending on if pt comfortable going home. Throughout the am, pt ambulated independently with walker. He then said he felt good about going home. This RN told Dr. Maurice Small and discharge orders were given. Pt's room belongings packed and PIV removed. Old dressing was removed, and per Dr. Maurice Small was left open to air. A small piece of gauze was placed over an area that started bleeding. The patient and his wife were both educated on all discharge information and had no questions. Pt escorted in wheelchair to private vehicle.  Robina Ade, RN

## 2020-08-14 NOTE — Discharge Instructions (Signed)
Discharge Instructions  Don't lift more than 20 pounds if possible. Otherwise no restriction in activities, slowly increase your activity back to normal.   Okay to shower on the day of discharge. Use regular soap and water and try to be gentle when cleaning your incision. No need for a dressing on your incision. In the first few days after surgery, there may be some bloody drainage from your wound. If this happens, you can cover your incision with a gauze dressing to prevent it from staining your clothes or bed linens. If your incision begins to itch, rub some bacitracin or neosporin ointment on it instead of scratching it.  Follow up with Dr. Venetia Maxon in 2 weeks after discharge. If you do not already have a discharge appointment, please call his office at [phone] to schedule a follow up appointment. If you have any concerns or questions, please call the office and let us know.

## 2021-01-13 IMAGING — XA DG MYELOGRAPHY LUMBAR INJ LUMBOSACRAL
12 of 17 series · 12 of 17 positions shown · IV contrast (omnipaque)
Comparison: CT 05/24/2020, and previous

CLINICAL DATA: Previous lumbar fusion. Preop planning for revision.

EXAM:
LUMBAR MYELOGRAM
CT LUMBAR SPINE WITH INTRATHECAL CONTRAST
FLUOROSCOPY TIME:  47 seconds; 519  uTymN DAP
TECHNIQUE: The procedure, risks (including but not limited to bleeding,
infection, organ damage ), benefits, and alternatives were explained
to the patient. Questions regarding the procedure were encouraged
and answered. The patient understands and consents to the procedure.
An appropriate entry site was determined under fluoroscopy. Operator
donned sterile gloves and mask. Skin site was marked, prepped with
Betadine, and draped in usual sterile fashion, and infiltrated
locally with 1% lidocaine. A 22 gauge spinal needle was advanced
into the thecal sac at L2 from a right parasagittal approach. Clear
colorless CSF returned. 17 ml Omnipaque 180 were administered
intrathecally for lumbar myelography, followed by axial CT scanning
of the lumbar spine. I personally performed the lumbar puncture and
administered the intrathecal contrast. I also personally supervised
acquisition of the myelogram images. Coronal and sagittal
reconstructions were generated from the axial scan.

[Series 1: vasc adipose · 1 of 1 slices shown (1 of 10)]
[im 1/1]
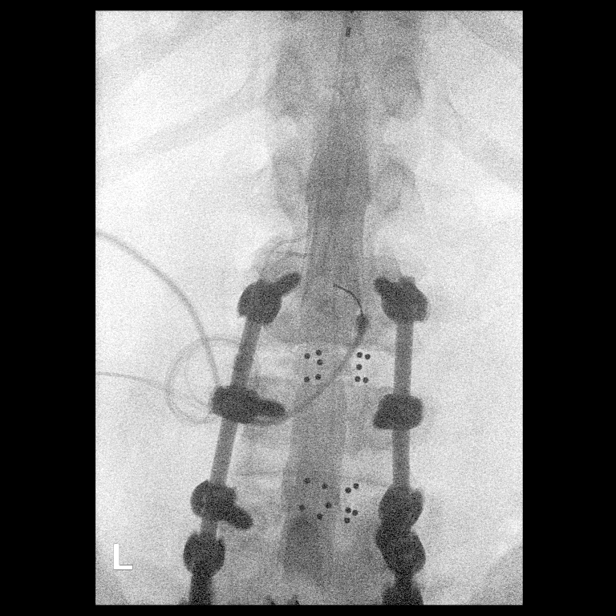

[Series 2: w lumbar spine flexion · 0.15mm/px · 1 of 1 slices shown]
[im 1/1]
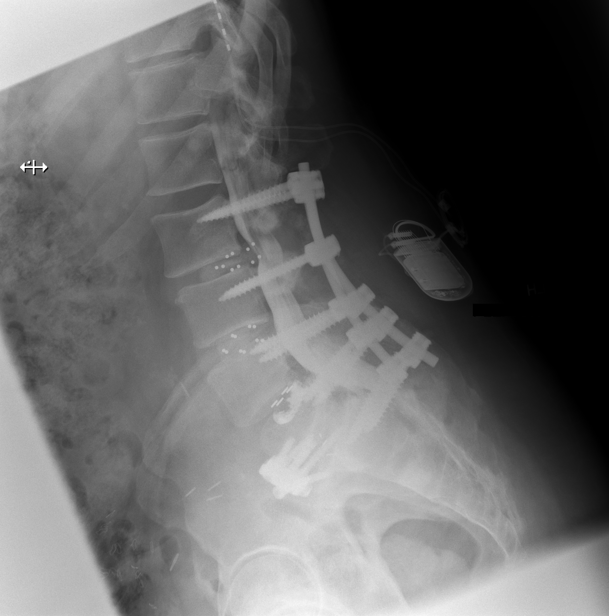

[Series 2: vasc adipose · 1 of 1 slices shown (2 of 10)]
[im 1/1]
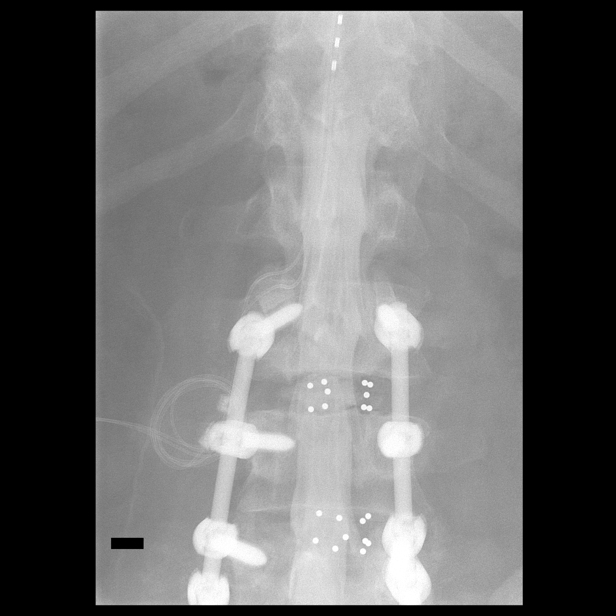

[Series 3: w lumbar spine extension · 0.15mm/px · 1 of 1 slices shown]
[im 1/1]
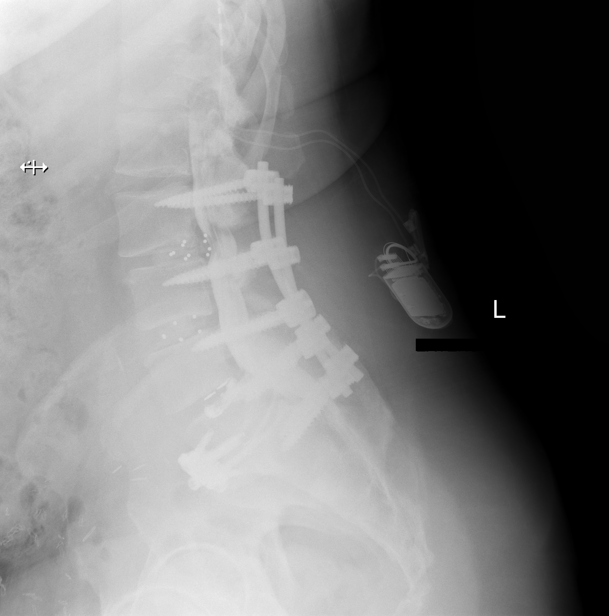

[Series 4: vasc adipose · 1 of 1 slices shown (3 of 10)]
[im 1/1]
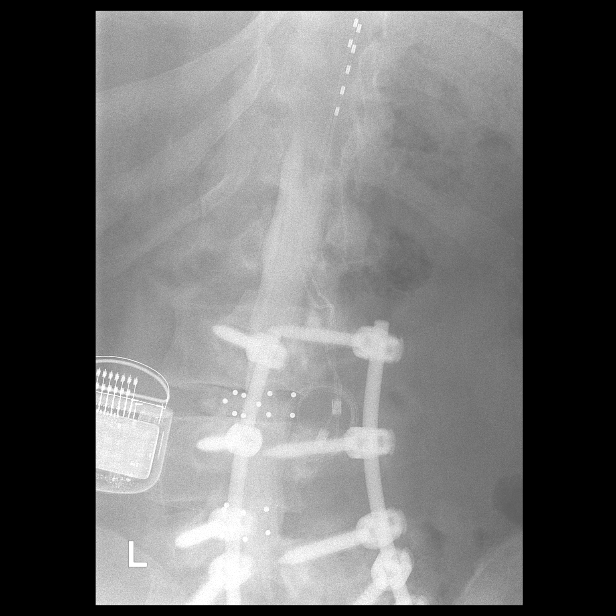

[Series 5: vasc adipose · 1 of 1 slices shown (4 of 10)]
[im 1/1]
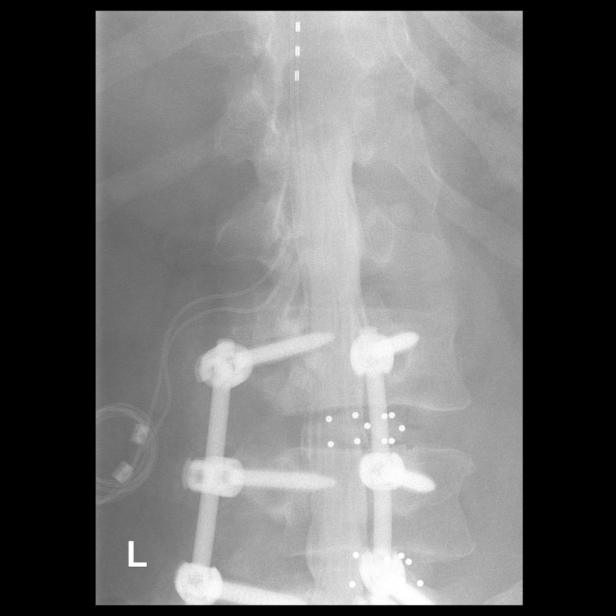

[Series 7: vasc adipose · 1 of 1 slices shown (5 of 10)]
[im 1/1]
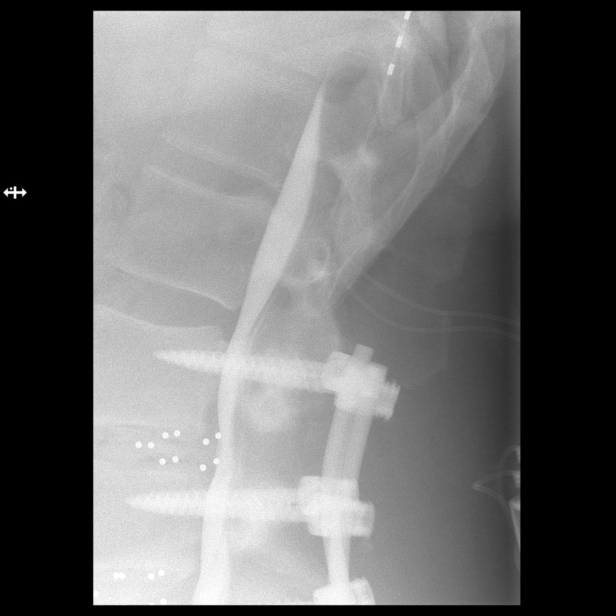

[Series 8: vasc adipose · 1 of 1 slices shown (6 of 10)]
[im 1/1]
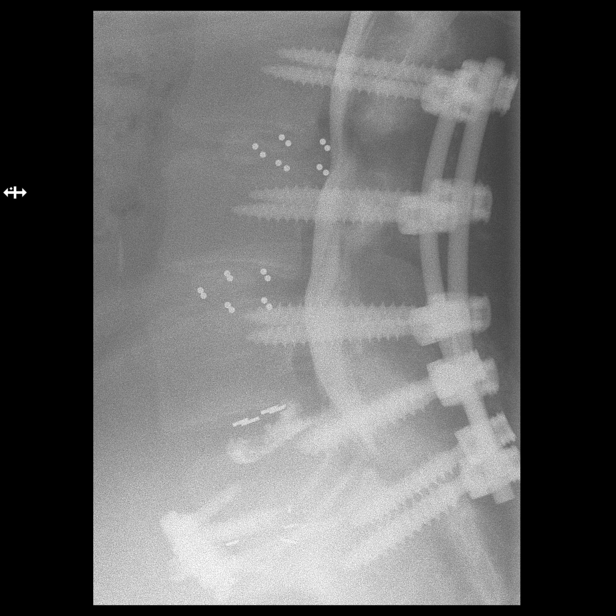

[Series 10: vasc adipose · 1 of 1 slices shown (7 of 10)]
[im 1/1]
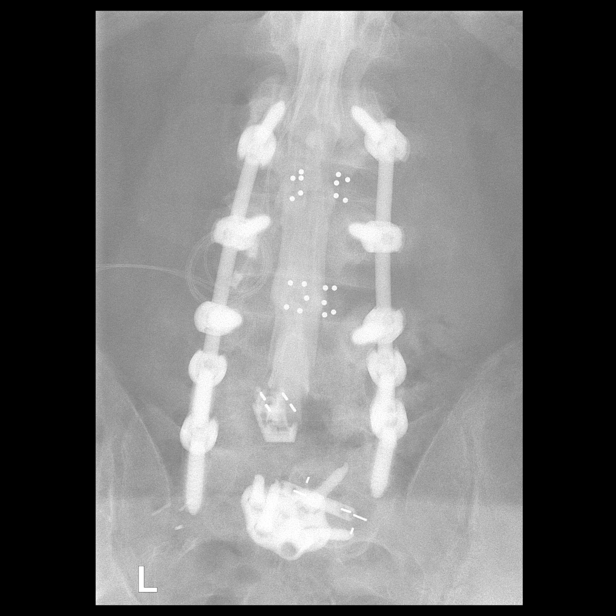

[Series 11: vasc adipose · 1 of 1 slices shown (8 of 10)]
[im 1/1]
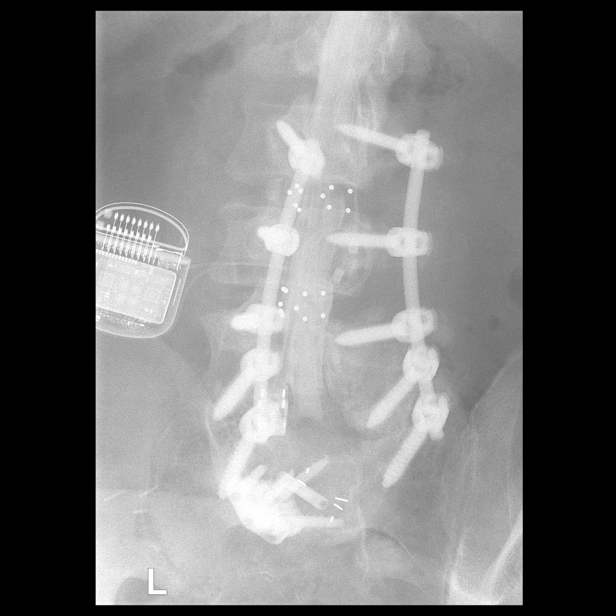

[Series 12: vasc adipose · 1 of 1 slices shown (9 of 10)]
[im 1/1]
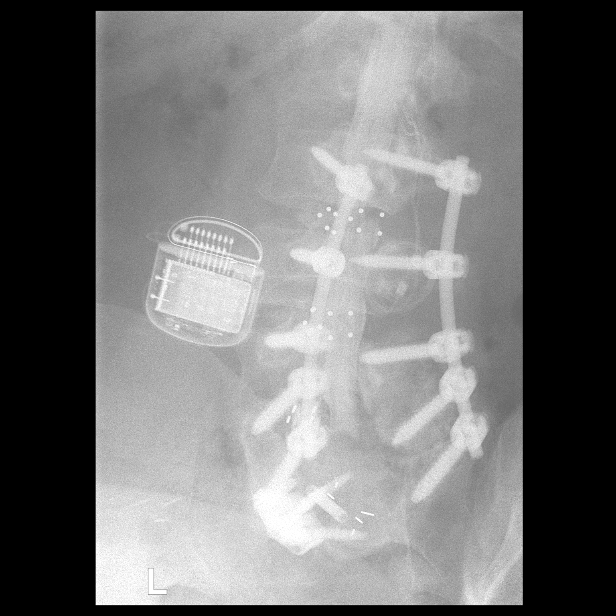

[Series 14: vasc adipose · 1 of 1 slices shown (10 of 10)]
[im 1/1]
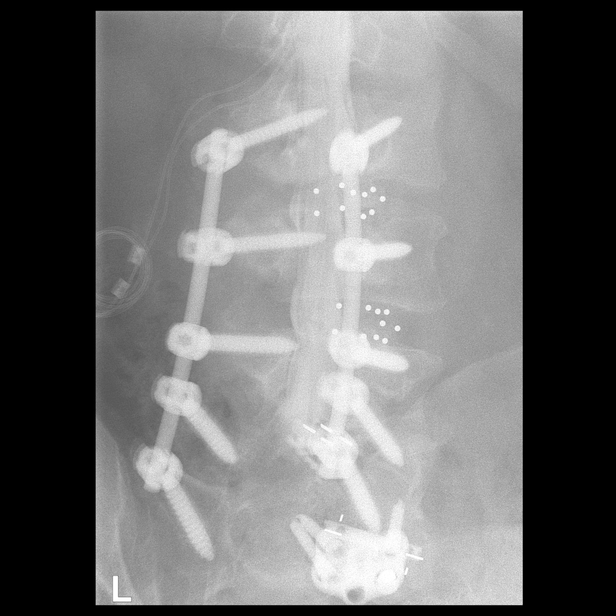

[12 of 17 positions shown; findings below may reference images not displayed]

FINDINGS: 5 rib-bearing lumbar segments assigned L1-L5 as before. No fracture
or dislocation.

T10-11: Dorsal stimulator catheter, cephalad extent not visualized.
The interspace unremarkable.

T11-12: Unremarkable

T12-L1: Stable small bone islands in L1 since 12/24/2016. Interspace
unremarkable. Conus terminates behind the interspace. No spinal
stenosis. Paired dorsal catheters enter the epidural space at this
level from the left.

L1-2: Interspace unremarkable. No spinal or foraminal stenosis. Mild
facet DJD left greater than right.

L2-3: Interval posterior decompression and placement of bilateral
pedicle screws with vertical interconnecting hardware, intact. There
is a fracture through the left L2 lamina extending to the inferior
aspect of the left pedicle, and some lucency around the left-sided
laminar screw. Graft with markers in the interspace, extending
posteriorly approximately 3 mm into the anterior epidural space with
mild distortion of the anterior aspect of the thecal sac. No
convincing solid bone bridging. Stable stable Schmorl's node in the
inferior endplate of L2. Mild spinal stenosis. Foramina patent.

L3-4: Interval posterior decompression and extension of posterior
fixation hardware with stable bilateral L4 pedicle screws. Graft
with markers in the interspace, without solid bone bridging. No
spinal stenosis. Foramina patent.

L4-5: Posterior spinal fusion hardware with bilateral pedicle
screws, intact without surrounding lucency. Previous posterior
decompression. Cage with graft in the interspace with minimal
subsidence into the endplates. No definite solid bone bridging. No
spinal stenosis. Endplate spurring mildly encroaches upon the left
neural foramen.

L5-S1: Posterior spinal fusion hardware continues across this level,
intact without surrounding lucency. Previous posterior
decompression. Graft in the interspace with anterior fixation
hardware, moderate subsidence into L5 and S1 with evidence of bone
bridging across the interspace. Stable grade 1 anterolisthesis
L5-S1.

Aortic Atherosclerosis (UCWUT-170.0). Subcutaneous scarring in the
midline posterior to lumbar spine previous surgeries. Remainder
visualized paraspinal soft tissues unremarkable. Subcutaneous left
flank stimulator device. Multiple small partially calcified stones
in the visualized portion of the gallbladder.
IMPRESSION: 1. Interval posterior decompression and extension of posterior
fixation hardware L2-S1, with a fracture through the left L2 lamina
extending to the inferior aspect of the left pedicle, and some
lucency around the left-sided laminar screw. The L2-3 interbody
graft material extends a few mm into the anterior epidural space.
2. Interval PLIF L3-4 without complicating features.
3. Early subsidence into the endplates at L4-5 and L5-S1, with
evidence of bone bridging across the L5-S1 interspace, and stable
grade 1 anterolisthesis.
4. Cholelithiasis.

Aortic Atherosclerosis (UCWUT-BNE.E).
